# Patient Record
Sex: Female | Born: 1980 | Race: White | Hispanic: No | Marital: Married | Smoking: Never smoker
Health system: Southern US, Community
[De-identification: ages and names within clinical notes are randomized; demographics above are authoritative.]

## PROBLEM LIST (undated history)

## (undated) DIAGNOSIS — K589 Irritable bowel syndrome without diarrhea: Secondary | ICD-10-CM

## (undated) DIAGNOSIS — K219 Gastro-esophageal reflux disease without esophagitis: Secondary | ICD-10-CM

## (undated) HISTORY — PX: COSMETIC SURGERY: SHX468

## (undated) HISTORY — PX: BREAST ENHANCEMENT SURGERY: SHX7

## (undated) HISTORY — PX: WISDOM TOOTH EXTRACTION: SHX21

## (undated) HISTORY — PX: APPENDECTOMY: SHX54

## (undated) HISTORY — PX: TUBAL LIGATION: SHX77

## (undated) HISTORY — DX: Gastro-esophageal reflux disease without esophagitis: K21.9

---

## 2008-04-27 DIAGNOSIS — G43909 Migraine, unspecified, not intractable, without status migrainosus: Secondary | ICD-10-CM | POA: Insufficient documentation

## 2018-04-27 DIAGNOSIS — L409 Psoriasis, unspecified: Secondary | ICD-10-CM | POA: Insufficient documentation

## 2019-11-19 LAB — HM PAP SMEAR: HPV, high-risk: NEGATIVE

## 2020-09-08 ENCOUNTER — Encounter (HOSPITAL_COMMUNITY): Payer: Self-pay | Admitting: *Deleted

## 2020-09-08 ENCOUNTER — Ambulatory Visit (HOSPITAL_COMMUNITY)
Admission: EM | Admit: 2020-09-08 | Discharge: 2020-09-08 | Disposition: A | Discharge status: Home / Self Care | Attending: Emergency Medicine | Admitting: Emergency Medicine

## 2020-09-08 ENCOUNTER — Other Ambulatory Visit: Payer: Self-pay

## 2020-09-08 DIAGNOSIS — R197 Diarrhea, unspecified: Secondary | ICD-10-CM

## 2020-09-08 HISTORY — DX: Irritable bowel syndrome, unspecified: K58.9

## 2020-09-08 MED ORDER — ONDANSETRON 8 MG PO TBDP: ORAL_TABLET | ORAL | 0 refills | Status: DC

## 2020-09-08 MED ORDER — ACIDOPHILUS PROBIOTIC BLEND PO CAPS: 2.0000 | ORAL_CAPSULE | Freq: Two times a day (BID) | ORAL | 0 refills | Status: DC

## 2020-09-08 MED ORDER — DIPHENOXYLATE-ATROPINE 2.5-0.025 MG PO TABS: 1.0000 | ORAL_TABLET | Freq: Four times a day (QID) | ORAL | 0 refills | Status: DC | PRN

## 2020-09-08 NOTE — Discharge Instructions (Addendum)
Try Zofran and probiotics first.  You may use Lomotil if your symptoms persist.  Please bring back a stool sample as soon as you can.  I have placed a referral to GI.  Go to the ER for fevers above 100.4, severe abdominal pain, no urine output in 12 hours, or for any other concern  Below is a list of primary care practices who are taking new patients for you to follow-up with.  [facility] internal medicine clinic Ground Floor - [facility], [address] [place], [place], [place] [address] [phone]  [facility] at [facility] [address] [place], [place] [address] [phone]  [facility] [address] [place], [place] [address] [phone]  [facility] [phone] [address] [place] [address]  [facility]: [address] [place] [address]  [phone]  [facility]: [address] [place] [address]   [phone]  [facility]: [address] [place] [address]  [phone]  [facility] : [address] [place] [address] [phone]  [facility]: [address] [place] [address] [phone]  [facility]: [address] [place] [address] [phone]  Dr. Oneal Grout [address] [facility] [place] [address]  [phone]  Go to www.goodrx.com  or www.costplusdrugs.com to look up your medications. This will give you a list of where you can find your prescriptions at the most affordable prices. Or ask the pharmacist what the cash price is, or if they have any other discount programs available to help make your medication more affordable. This can be less expensive than what you would pay with  insurance.

## 2020-09-08 NOTE — ED Notes (Signed)
Supplies and instructions provided to pt in order to obtain home stool sample.  Pt verbalized understanding of all instructions.

## 2020-09-08 NOTE — ED Provider Notes (Signed)
HPI  SUBJECTIVE:  Phyllis Francis is a 40 y.o. female who presents with 4 days of watery, nonbloody diarrhea.  She reports anywhere from 12-14 episodes in a 24-hour period of time.  She states that it is "straight water".  She states that she feels gassy, has diffuse abdominal cramping worse before having a bowel movement and better afterwards.  She denies fevers, nausea, vomiting, change in urine output.  No recent travel, questionable leftovers, raw or undercooked foods, recent antibiotic, known exposure to C. 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  No exposure to chickens or reptiles.  No nasal congestion, rhinorrhea, sore throat, loss of sense of smell or taste, cough, shortness of breath.  No known COVID exposure.  She got the second dose of the Pfizer vaccine last year.  No antipyretic in the past 6 hours.  She does not take any medications on a regular basis.  She tried simethicone and Pepto-Bismol with very mild and temporary relief in her symptoms.  Symptoms are worse when she tries to eat.  She has a past medical history of IBS and is status post appendectomy.  No history of diabetes, hypertension, gastroparesis, C. difficile.  LMP: She is status post tubal ligation and denies the possibility being pregnant.  PMD: None.    Past Medical History:  Diagnosis Date  . IBS (irritable bowel syndrome)     Past Surgical History:  Procedure Laterality Date  . APPENDECTOMY    . BREAST ENHANCEMENT SURGERY    . CESAREAN SECTION    . TUBAL LIGATION    . WISDOM TOOTH EXTRACTION      Family History  Problem Relation Age of Onset  . Cancer Father     Social History   Tobacco Use  . Smoking status: Never Smoker  . Smokeless tobacco: Never Used  Vaping Use  . Vaping Use: Never used  Substance Use Topics  . Alcohol use: Yes    Comment: "in moderation, not recently"  . Drug use: Never    No current facility-administered medications for this encounter.  Current  Outpatient Medications:  .  diphenoxylate-atropine (LOMOTIL) 2.5-0.025 MG tablet, Take 1 tablet by mouth 4 (four) times daily as needed for diarrhea or loose stools., Disp: 30 tablet, Rfl: 0 .  ondansetron (ZOFRAN ODT) 8 MG disintegrating tablet, 1/2- 1 tablet q 8 hr prn nausea, vomiting, Disp: 20 tablet, Rfl: 0 .  Probiotic Product (ACIDOPHILUS PROBIOTIC BLEND) CAPS, Take 2 capsules by mouth 2 (two) times daily., Disp: 60 capsule, Rfl: 0  Allergies  Allergen Reactions  . Sulfa Antibiotics Anaphylaxis  . Cephalosporins Rash     ROS  As noted in HPI.   Physical Exam  BP 112/69   Pulse 67   Temp 98.4 F (36.9 C) (Oral)   Resp 18   LMP 08/16/2020 (Approximate)   SpO2 98%   Constitutional: Well developed, well nourished, no acute distress Eyes:  EOMI, conjunctiva normal bilaterally HENT: Normocephalic, atraumatic,mucus membranes moist Respiratory: Normal inspiratory effort Cardiovascular: Normal rate, regular rhythm, no murmurs rubs or gallop GI: Normal appearance, soft, nontender, nondistended.  Active bowel sounds.  No rebound, guarding skin: No rash, skin intact Musculoskeletal: no deformities Neurologic: Alert & oriented x 3, no focal neuro deficits Psychiatric: Speech and behavior appropriate   ED Course   Medications - No data to display  Orders Placed This Encounter  Procedures  . C difficile Toxins A+B W/Rflx    Standing Status:   Future  Standing Expiration Date:   09/10/2021  . Gastrointestinal Pathogen Panel PCR    Standing Status:   Future    Standing Expiration Date:   09/10/2021  . Ambulatory referral to Gastroenterology    Referral Priority:   Routine    Referral Type:   Consultation    Referral Reason:   Specialty Services Required    Number of Visits Requested:   1    No results found for this or any previous visit (from the past 24 hour(s)). No results found.  ED Clinical Impression  1. Diarrhea, unspecified type      ED  Assessment/Plan  Unable to identify the cause based on history.  She is having anywhere between 12-14 episodes of diarrhea per 24-hour period for the past 4 days which defines it as a severe illness.  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  Placing future order for this.  Patient unable to provide specimen right now.  Will send home with Zofran, probiotics, Lomotil, GI referral.  will give patient a primary care list and place order for assistance in finding a PMD.  ER return precautions given.  Discussed  MDM, treatment plan, and plan for follow-up with patient. Discussed sn/sx that should prompt return to the ED. patient agrees with plan.   Meds ordered this encounter  Medications  . diphenoxylate-atropine (LOMOTIL) 2.5-0.025 MG tablet    Sig: Take 1 tablet by mouth 4 (four) times daily as needed for diarrhea or loose stools.    Dispense:  30 tablet    Refill:  0  . Probiotic Product (ACIDOPHILUS PROBIOTIC BLEND) CAPS    Sig: Take 2 capsules by mouth 2 (two) times daily.    Dispense:  60 capsule    Refill:  0  . ondansetron (ZOFRAN ODT) 8 MG disintegrating tablet    Sig: 1/2- 1 tablet q 8 hr prn nausea, vomiting    Dispense:  20 tablet    Refill:  0      *This clinic note was created using Scientist, clinical (histocompatibility and immunogenetics). Therefore, there may be occasional mistakes despite careful proofreading.  ?    Domenick Gong, MD 09/09/20 [phone]

## 2020-09-08 NOTE — ED Triage Notes (Signed)
C/O watery diarrhea x 3 days; states has approx 4 episodes at night and maybe 8-10 episodes during the day.  Denies any blood in stool.  Denies n/v, fevers.  C/O intermittent abd cramping.

## 2020-09-09 ENCOUNTER — Telehealth (HOSPITAL_COMMUNITY): Payer: Self-pay | Admitting: Internal Medicine

## 2020-09-09 ENCOUNTER — Telehealth (HOSPITAL_COMMUNITY): Payer: Self-pay | Admitting: Family Medicine

## 2020-09-09 DIAGNOSIS — A09 Infectious gastroenteritis and colitis, unspecified: Secondary | ICD-10-CM

## 2020-09-09 NOTE — Telephone Encounter (Signed)
Incorrect stool labs placed initially, corrected GI panel, cannot find correct c dif labs at this time - will update if located

## 2020-09-10 ENCOUNTER — Encounter: Payer: Self-pay | Admitting: Physician Assistant

## 2020-09-10 LAB — GASTROINTESTINAL PANEL BY PCR, STOOL (REPLACES STOOL CULTURE)

## 2020-09-11 MED ORDER — AZITHROMYCIN 500 MG PO TABS: 500.0000 mg | ORAL_TABLET | Freq: Every day | ORAL | 0 refills | Status: AC

## 2020-09-11 NOTE — Telephone Encounter (Signed)
Patient with Enteroaggregative E. coli infection.  Talked to pt and sx have not resolved after 6 days. Per up-to-date, antibiotic therapy is reasonable in patients with a pathogenic E. coli infection with diarrhea that is severe i.e. more than 6 stools per day or has lasted 7 days or more.  Recommends azithromycin 500 mg once a day for 3 days.  E prescribed this to pharmacy on record.  She is to continue Lomotil, Zofran, push electrolyte containing fluids.  Will need to follow-up with GI if it persists after the antibiotics.  Discussed lab results, antibiotic prescription, and plan for follow-up with patient.  She agrees with plan.

## 2020-09-20 ENCOUNTER — Telehealth (HOSPITAL_BASED_OUTPATIENT_CLINIC_OR_DEPARTMENT_OTHER): Payer: Self-pay

## 2020-09-20 NOTE — Telephone Encounter (Signed)
-----   Message from Aaron Edelman, RN sent at 09/20/2020  1:09 PM EDT ----- Regarding: UC to PCP Patient needs to establish with PCP - routine

## 2020-10-02 ENCOUNTER — Encounter: Payer: Self-pay | Admitting: Physician Assistant

## 2020-10-02 ENCOUNTER — Ambulatory Visit (INDEPENDENT_AMBULATORY_CARE_PROVIDER_SITE_OTHER): Admitting: Physician Assistant

## 2020-10-02 ENCOUNTER — Other Ambulatory Visit: Payer: Self-pay

## 2020-10-02 DIAGNOSIS — K589 Irritable bowel syndrome without diarrhea: Secondary | ICD-10-CM | POA: Insufficient documentation

## 2020-10-02 DIAGNOSIS — K582 Mixed irritable bowel syndrome: Secondary | ICD-10-CM

## 2020-10-02 NOTE — Progress Notes (Signed)
Subjective:    Patient ID: Phyllis Francis, female    DOB: 03/26/1981, 40 y.o.   MRN: [id number]  HPI Phyllis Francis is a pleasant 40 year old white female, new to GI today referred after recent [facility] visit at which time she was seen with acute diarrhea. Patient is generally in good health and has not had any prior GI evaluation. She had onset of her acute symptoms in mid May with what she describes as terrible diarrhea that lasted for a total of about 8 days.  This was associated with extreme urgency and bad abdominal cramping gas and bloating.  She did not have any nausea or vomiting, no fever or chills.  She tried to treat herself for the first couple of days but when symptoms were not improving she was seen at urgent care.  She did have GI path panel done that returned positive for E AEC (Enteroaggregative  E. Coli).  She was then given a 3-day course of azithromycin.  She says by the time she had taken the third dose of the antibiotics symptoms had almost resolved. She was also advised to start an over-the-counter probiotic, and says that actually her bowel movements are more normal now than they had been in a very long time.  She is having normal bowel movements without any residual abdominal cramping or discomfort, no complaints of bloating or nausea. She does say that she feels that she has had IBS "all of my life".  It sounds as if she has had chronic issues for many years with alternating bowel habits, sometimes having frequent loose stools and other times having episodes of constipation.  She also feels that she is lactose intolerant.  She says she recently had a frosty from [facility] and was miserable afterwards with abdominal bloating and cramping.  She generally avoids lactose though she does consume some cheeses.  She also feels that she is intolerant to red meat just unable to digest easily and says is generally miserable after pizza and attributes this to the bread.  On further  discussion she is able to eat other bread products without any symptoms.  Family history is negative for GI disease as far she is aware  Review of Systems Pertinent positive and negative review of systems were noted in the above HPI section.  All other review of systems was otherwise negative.  Outpatient Encounter Medications as of 10/02/2020  Medication Sig  . Probiotic Product (PROBIOTIC DAILY PO) Take 2 tablets by mouth daily.  . [DISCONTINUED] diphenoxylate-atropine (LOMOTIL) 2.5-0.025 MG tablet Take 1 tablet by mouth 4 (four) times daily as needed for diarrhea or loose stools. (Patient not taking: Reported on 10/02/2020)  . [DISCONTINUED] ondansetron (ZOFRAN ODT) 8 MG disintegrating tablet 1/2- 1 tablet q 8 hr prn nausea, vomiting (Patient not taking: Reported on 10/02/2020)  . [DISCONTINUED] Probiotic Product (ACIDOPHILUS PROBIOTIC BLEND) CAPS Take 2 capsules by mouth 2 (two) times daily. (Patient not taking: Reported on 10/02/2020)   No facility-administered encounter medications on file as of 10/02/2020.   Allergies  Allergen Reactions  . Sulfa Antibiotics Anaphylaxis  . Cephalosporins Rash   Patient Active Problem List   Diagnosis Date Noted  . IBS (irritable bowel syndrome) 10/02/2020   Social History   Socioeconomic History  . Marital status: Married    Spouse name: Not on file  . Number of children: Not on file  . Years of education: Not on file  . Highest education level: Not on file  Occupational History  .  Not on file  Tobacco Use  . Smoking status: Never Smoker  . Smokeless tobacco: Never Used  Vaping Use  . Vaping Use: Never used  Substance and Sexual Activity  . Alcohol use: Yes    Comment: 0-1 drink per day  . Drug use: Never  . Sexual activity: Yes    Birth control/protection: Surgical  Other Topics Concern  . Not on file  Social History Narrative  . Not on file   Social Determinants of Health   Financial Resource Strain: Not on file  Food Insecurity:  Not on file  Transportation Needs: Not on file  Physical Activity: Not on file  Stress: Not on file  Social Connections: Not on file  Intimate Partner Violence: Not on file    Ms. Filosa's family history includes Cancer in her father and paternal grandfather.      Objective:    Vitals:   10/02/20 1452  BP: 108/62  Pulse: (!) 58    Physical Exam Well-developed well-nourished WF in no acute distress.  Height, [id number]  BMI25.6  HEENT; nontraumatic normocephalic, EOMI, PE R LA, sclera anicteric. Oropharynx; not done Neck; supple, no JVD Cardiovascular; regular rate and rhythm with S1-S2, no murmur rub or gallop Pulmonary; Clear bilaterally Abdomen; soft, nontender, nondistended, no palpable mass or hepatosplenomegaly, bowel sounds are active Rectal;not done Skin; benign exam, no jaundice rash or appreciable lesions Extremities; no clubbing cyanosis or edema skin warm and dry Neuro/Psych; alert and oriented x4, grossly nonfocal mood and affect appropriate       Assessment & Plan:   #35 40 year old white female with recent acute infectious colitis May 2022 diagnosed with EA EC which completely resolved with a short course of azithromycin.  #2 underlying IBS Long history of alternating bowel habits #3 lactose intolerance  Plan; Patient currently doing very well on a lactobacillus containing probiotic which has helped normalize bowel movements.  Advised to continue daily probiotic over the next couple of months and then may want to try intermittent probiotics. She was given a copy of a lactose-free diet, advised continued avoidance, can use Lactaid tablets prior to any known lactose consumption We discussed testing for celiac disease though she feels that she tolerates all other bread products etc. without difficulty and she opted not to be tested for this.  Follow-up will be on a as needed basis with myself or Dr. Meridee Score. We discussed colon cancer screening with  colonoscopy to start at age 35. Happy to see her back as needed.  Cincere Deprey S Janayla Marik PA-C 10/02/2020   Cc: Domenick Gong, MD

## 2020-10-02 NOTE — Progress Notes (Signed)
Attending Physician's Attestation   I have reviewed the chart.   I agree with the Advanced Practitioner's note, impression, and recommendations with any updates as below.    Valeri Sula Mansouraty, MD [facility] Advanced Endoscopy Office # [phone]  

## 2020-10-02 NOTE — Patient Instructions (Signed)
If you are age 41 or younger, your body mass index should be between 19-25. Your Body mass index is 25.65 kg/m. If this is out of the aformentioned range listed, please consider follow up with your Primary Care Provider.  __________________________________________________________  The [facility] providers would like to encourage you to use [facility] to communicate with providers for non-urgent requests or questions.  Due to long hold times on the telephone, sending your provider a message by [facility] may be a faster and more efficient way to get a response.  Please allow 48 business hours for a response.  Please remember that this is for non-urgent requests.   Continue daily probiotics for the next 2 months, then you can continue daily or as needed.  Drink 60 ounces of water daily  Avoid lactose.  Follow up with Mike Gip, PA-C or Dr. Barron Alvine as needed.  You will be due for your Colonoscopy at age 46.  Thank you for entrusting me with your care and choosing [facility].  Amy Esterwood, PA-C

## 2020-11-20 LAB — HM MAMMOGRAPHY

## 2020-11-28 ENCOUNTER — Other Ambulatory Visit: Payer: Self-pay | Admitting: Obstetrics and Gynecology

## 2020-11-28 DIAGNOSIS — R928 Other abnormal and inconclusive findings on diagnostic imaging of breast: Secondary | ICD-10-CM

## 2020-12-09 LAB — HM MAMMOGRAPHY

## 2020-12-16 ENCOUNTER — Other Ambulatory Visit

## 2020-12-16 ENCOUNTER — Encounter

## 2020-12-19 ENCOUNTER — Ambulatory Visit (INDEPENDENT_AMBULATORY_CARE_PROVIDER_SITE_OTHER): Admitting: Physician Assistant

## 2020-12-19 ENCOUNTER — Encounter: Payer: Self-pay | Admitting: Physician Assistant

## 2020-12-19 ENCOUNTER — Other Ambulatory Visit (INDEPENDENT_AMBULATORY_CARE_PROVIDER_SITE_OTHER)

## 2020-12-19 VITALS — BP 100/60 | HR 48 | Ht 65.0 in | Wt 155.0 lb

## 2020-12-19 DIAGNOSIS — R1030 Lower abdominal pain, unspecified: Secondary | ICD-10-CM | POA: Diagnosis not present

## 2020-12-19 DIAGNOSIS — K5900 Constipation, unspecified: Secondary | ICD-10-CM | POA: Diagnosis not present

## 2020-12-19 DIAGNOSIS — R1031 Right lower quadrant pain: Secondary | ICD-10-CM

## 2020-12-19 DIAGNOSIS — A09 Infectious gastroenteritis and colitis, unspecified: Secondary | ICD-10-CM | POA: Insufficient documentation

## 2020-12-19 LAB — CBC WITH DIFFERENTIAL/PLATELET
Basophils Absolute: 0.1 10*3/uL (ref 0.0–0.1)
Basophils Relative: 0.9 % (ref 0.0–3.0)
Eosinophils Absolute: 0.2 10*3/uL (ref 0.0–0.7)
Eosinophils Relative: 2.8 % (ref 0.0–5.0)
HCT: 39.1 % (ref 36.0–46.0)
Hemoglobin: 13.2 g/dL (ref 12.0–15.0)
Lymphocytes Relative: 29.8 % (ref 12.0–46.0)
Lymphs Abs: 1.9 10*3/uL (ref 0.7–4.0)
MCHC: 33.7 g/dL (ref 30.0–36.0)
MCV: 91.9 fl (ref 78.0–100.0)
Monocytes Absolute: 0.6 10*3/uL (ref 0.1–1.0)
Monocytes Relative: 10.1 % (ref 3.0–12.0)
Neutro Abs: 3.5 10*3/uL (ref 1.4–7.7)
Neutrophils Relative %: 56.4 % (ref 43.0–77.0)
Platelets: 157 10*3/uL (ref 150.0–400.0)
RBC: 4.26 Mil/uL (ref 3.87–5.11)
RDW: 12.7 % (ref 11.5–15.5)
WBC: 6.2 10*3/uL (ref 4.0–10.5)

## 2020-12-19 LAB — BASIC METABOLIC PANEL
BUN: 14 mg/dL (ref 6–23)
CO2: 26 mEq/L (ref 19–32)
Calcium: 9.7 mg/dL (ref 8.4–10.5)
Chloride: 104 mEq/L (ref 96–112)
Creatinine, Ser: 0.93 mg/dL (ref 0.40–1.20)
GFR: 76.63 mL/min (ref >60.00)
Glucose, Bld: 86 mg/dL (ref 70–99)
Potassium: 4.2 mEq/L (ref 3.5–5.1)
Sodium: 139 mEq/L (ref 135–145)

## 2020-12-19 LAB — C-REACTIVE PROTEIN: CRP: <1 mg/dL (ref 0.5–20.0)

## 2020-12-19 LAB — SEDIMENTATION RATE: Sed Rate: 5 mm/hr (ref 0–20)

## 2020-12-19 NOTE — Patient Instructions (Signed)
If you are age 41 or younger, your body mass index should be between 19-25. Your Body mass index is 25.79 kg/m. If this is out of the aformentioned range listed, please consider follow up with your Primary Care Provider.  __________________________________________________________  The [facility] providers would like to encourage you to use [facility] to communicate with providers for non-urgent requests or questions.  Due to long hold times on the telephone, sending your provider a message by [facility] may be a faster and more efficient way to get a response.  Please allow 48 business hours for a response.  Please remember that this is for non-urgent requests.   You have been scheduled for a CT scan of the abdomen and pelvis at [facility] ([address]---this is in the same building as [employer]).   You are scheduled on 12/26/2020 at 2:00 pm. You should arrive 15 minutes prior to your appointment time for registration. Please follow the written instructions below on the day of your exam:  WARNING: IF YOU ARE ALLERGIC TO IODINE/X-RAY DYE, PLEASE NOTIFY RADIOLOGY IMMEDIATELY AT [phone]! YOU WILL BE GIVEN A 13 HOUR PREMEDICATION PREP.  1) Do not eat anything after 10:00 am (4 hours prior to your test) 2) You have been given 2 bottles of oral contrast to drink. The solution may taste better if refrigerated, but do NOT add ice or any other liquid to this solution. Shake well before drinking.    Drink 1 bottle of contrast @ 12:00 pm (2 hours prior to your exam)  Drink 1 bottle of contrast @ 1:00 pm (1 hour prior to your exam)  You may take any medications as prescribed with a small amount of water, if necessary. If you take any of the following medications: METFORMIN, GLUCOPHAGE, GLUCOVANCE, AVANDAMET, RIOMET, FORTAMET, [place] MET, JANUMET, GLUMETZA or METAGLIP, you MAY be asked to HOLD this medication 48 hours AFTER the exam.  The purpose of you drinking the oral contrast  is to aid in the visualization of your intestinal tract. The contrast solution may cause some diarrhea. Depending on your individual set of symptoms, you may also receive an intravenous injection of x-ray contrast/dye. Plan on being at [facility] for 30 minutes or longer, depending on the type of exam you are having performed.  This test typically takes 30-45 minutes to complete.  If you have any questions regarding your exam or if you need to reschedule, you may call the CT department at [phone] between the hours of 8:00 am and 5:00 pm, Monday-Friday.  ____________________________________________________________  Your provider has requested that you go to the basement level for lab work before leaving today. Press "B" on the elevator. The lab is located at the first door on the left as you exit the elevator.  Amy Esterwood, PA-C recommends that you complete a bowel purge (to clean out your bowels). Please do the following: Purchase a bottle of Miralax over the counter as well as a box of 5 mg dulcolax tablets. Take 4 dulcolax tablets. Wait 1 hour. You will then drink 6-8 capfuls of Miralax mixed in an adequate amount of water/juice/gatorade (you may choose which of these liquids to drink) over the next 2-3 hours. You should expect results within 1 to 6 hours after completing the bowel purge.  Start Miralax 1 capful daily in 8 ounces of water or juice, after completing your Bowel purge.  You may use Dulcolax 1-2 a nightly.  Follow up pending the results of  your CT.  Thank you for entrusting me with your care and choosing [facility].  Amy Esterwood, PA-C

## 2020-12-19 NOTE — Progress Notes (Signed)
Subjective:    Patient ID: Phyllis Francis, female    DOB: Mar 04, 1981, 40 y.o.   MRN: [id number]  HPI Phyllis Francis" is a pleasant 40 year old white female, established with Dr. Rush Landmark who was seen by myself in June 2022 as a new patient.  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  When seen here in June was felt she had some postinfectious IBS symptoms superimposed on chronic IBS with alternating bowel habits and component of lactose intolerance.  She was feeling better at that time advised to continue a lactose-free diet, had taken a probiotic for an additional month. Comes in today stating that she has now developed constipation over the past 1 to 2 months and has been intermittently having pain in her right abdomen over the past month.  She is also had occasional mild left-sided abdominal pain but primarily this has been right-sided.  She has had to miss work because of constipation and need to use laxatives.  She has been taking Dulcolax as needed which she says is effective but will cause some cramping. She is status post appendectomy.  She describes the right-sided abdominal pain as aching and cramping in nature sometimes worse with being up and around walking etc.  This generally improves with a bowel movement which dulls the discomfort but does not resolve it.  Also with sensation of trapped gas.  He has been using Gas-X as needed. She may go as many as 3 days without a bowel movement and will feel miserable at that point.  Review of Systems Pertinent positive and negative review of systems were noted in the above HPI section.  All other review of systems was otherwise negative.   Outpatient Encounter Medications as of 12/19/2020  Medication Sig   Probiotic Product (PROBIOTIC DAILY PO) Take 2 tablets by mouth daily.   No facility-administered encounter medications on file as  of 12/19/2020.   Allergies  Allergen Reactions   Sulfa Antibiotics Anaphylaxis   Cephalosporins Rash   Patient Active Problem List   Diagnosis Date Noted   Infectious colitis 12/19/2020   IBS (irritable bowel syndrome) 10/02/2020   Social History   Socioeconomic History   Marital status: Married    Spouse name: Not on file   Number of children: Not on file   Years of education: Not on file   Highest education level: Not on file  Occupational History   Not on file  Tobacco Use   Smoking status: Never   Smokeless tobacco: Never  Vaping Use   Vaping Use: Never used  Substance and Sexual Activity   Alcohol use: Yes    Comment: 0-1 drink per day   Drug use: Never   Sexual activity: Yes    Birth control/protection: Surgical  Other Topics Concern   Not on file  Social History Narrative   Not on file   Social Determinants of Health   Financial Resource Strain: Not on file  Food Insecurity: Not on file  Transportation Needs: Not on file  Physical Activity: Not on file  Stress: Not on file  Social Connections: Not on file  Intimate Partner Violence: Not on file    Phyllis Francis's family history includes Cancer in her father and paternal grandfather.      Objective:    Vitals:   12/19/20 0956  BP: 100/60  Pulse: (!) 48    Physical Exam  Well-developed well-nourished young white female in no acute distress.  Weight, 155 BMI 25.7  HEENT; nontraumatic normocephalic, EOMI, PE R LA, sclera anicteric. Oropharynx; not examined today Neck; supple, no JVD Cardiovascular; regular rate and rhythm with S1-S2, no murmur rub or gallop Pulmonary; Clear bilaterally Abdomen; soft, nondistended, she is tender in the right mid and right lower quadrant, no guarding or rebound, no palpable mass or hepatosplenomegaly, bowel sounds are active Rectal; not done today Skin; benign exam, no jaundice rash or appreciable lesions Extremities; no clubbing cyanosis or edema skin warm and  dry Neuro/Psych; alert and oriented x4, grossly nonfocal mood and affect appropriate        Assessment & Plan:   #48 40 year old white female with history of an infectious colitis May 2022 and chronic underlying long-term IBS type symptoms with alternating bowel habits who comes in now with 1 to 69-monthhistory of constipation and a persisting right-sided abdominal pain which she describes as aching and cramping in nature. She will have some improvement in abdominal discomfort with a bowel movement but pain does not resolve.  She is tender on exam in the right mid and right lower quadrant. Patient is status post appendectomy\  Etiology of symptoms is not clear, this may be exacerbation of IBS however need to rule out other intra-abdominal inflammatory process, IBD.  Plan; CBC with differential, be met, sed rate, CRP Schedule for CT of the abdomen pelvis with contrast.\ I will give her a MiraLAX purge, then start MiraLAX 17 g in 8 ounces of water daily. She can continue to use Dulcolax 1 to 2 tablets daily if needed in addition to the MiraLAX. Further recommendations pending results of CT.  Montina Dorrance S Feliz Herard PA-C 12/19/2020   Cc: No ref. provider found

## 2020-12-19 NOTE — Progress Notes (Signed)
Attending Physician's Attestation   I have reviewed the chart.   I agree with the Advanced Practitioner's note, impression, and recommendations with any updates as below.    Kyian Obst Mansouraty, MD [facility] Office # [phone]  

## 2020-12-26 ENCOUNTER — Ambulatory Visit (INDEPENDENT_AMBULATORY_CARE_PROVIDER_SITE_OTHER)
Admission: RE | Admit: 2020-12-26 | Discharge: 2020-12-26 | Disposition: A | Discharge status: Home / Self Care | Source: Ambulatory Visit | Attending: Physician Assistant | Admitting: Physician Assistant

## 2020-12-26 ENCOUNTER — Other Ambulatory Visit: Payer: Self-pay

## 2020-12-26 DIAGNOSIS — R1030 Lower abdominal pain, unspecified: Secondary | ICD-10-CM | POA: Diagnosis not present

## 2020-12-26 DIAGNOSIS — R1031 Right lower quadrant pain: Secondary | ICD-10-CM

## 2020-12-26 DIAGNOSIS — K5900 Constipation, unspecified: Secondary | ICD-10-CM

## 2020-12-26 MED ORDER — IOHEXOL 300 MG/ML  SOLN
100.0000 mL | Freq: Once | INTRAMUSCULAR | Status: AC | PRN
  Administered 2020-12-26: 100 mL via INTRAVENOUS

## 2021-05-01 ENCOUNTER — Encounter (HOSPITAL_BASED_OUTPATIENT_CLINIC_OR_DEPARTMENT_OTHER): Payer: Self-pay | Admitting: Family Medicine

## 2021-05-02 ENCOUNTER — Encounter (HOSPITAL_BASED_OUTPATIENT_CLINIC_OR_DEPARTMENT_OTHER): Payer: Self-pay | Admitting: Family Medicine

## 2021-05-02 ENCOUNTER — Other Ambulatory Visit: Payer: Self-pay

## 2021-05-02 ENCOUNTER — Ambulatory Visit (INDEPENDENT_AMBULATORY_CARE_PROVIDER_SITE_OTHER): Admitting: Family Medicine

## 2021-05-02 DIAGNOSIS — L659 Nonscarring hair loss, unspecified: Secondary | ICD-10-CM

## 2021-05-02 DIAGNOSIS — R238 Other skin changes: Secondary | ICD-10-CM | POA: Insufficient documentation

## 2021-05-02 DIAGNOSIS — G43109 Migraine with aura, not intractable, without status migrainosus: Secondary | ICD-10-CM

## 2021-05-02 DIAGNOSIS — G43909 Migraine, unspecified, not intractable, without status migrainosus: Secondary | ICD-10-CM | POA: Insufficient documentation

## 2021-05-02 MED ORDER — SUMATRIPTAN SUCCINATE 50 MG PO TABS: 50.0000 mg | ORAL_TABLET | ORAL | 0 refills | Status: DC | PRN

## 2021-05-02 MED ORDER — KETOCONAZOLE 2 % EX SHAM: MEDICATED_SHAMPOO | CUTANEOUS | 1 refills | Status: DC

## 2021-05-02 MED ORDER — TRIAMCINOLONE ACETONIDE 0.1 % EX LOTN: 1.0000 "application " | TOPICAL_LOTION | Freq: Three times a day (TID) | CUTANEOUS | 0 refills | Status: DC

## 2021-05-02 NOTE — Progress Notes (Signed)
New Patient Office Visit  Subjective:  Patient ID: Phyllis Francis, female    DOB: 1981-04-05  Age: 41 y.o. MRN: [id number]  CC:  Chief Complaint  Patient presents with   Establish Care    No prior PCP in the last 3 years. Patient has primary been seeing OB and GI    Alopecia    Patient would like to have her thyroid levels assesed as she has been experiencing hair thinning and hair loss   Psoriasis    Patient has a patch on the back of her head orginating at the base of her neck and spreading upward into her hair line. She states she feels the break out is stress induced as she recently started school full time. Patient states she has tried to treat with OTC hydrocortisone cream but it is ineffective. The patch is scaly and itchy and get irritated when she washes her hair.   Migraine    Patient states that for the last several weeks she has noticed an increase in occuranes of migraines. She states they usually come in clusters that she could relate to her menstrual cycle but that seems to be changing as they can be induced by caffeine or alcohol. Patient states she is taking advil and chewable aspirin to manage and it is not effective    HPI Phyllis Francis is a 41 year old female presenting to establish in clinic.  She has current concerns as outlined above.  Past medical history significant for GERD, IBS.  Headaches: Reports that she has had a history of headaches in the past which have primarily coincided with her menstrual cycles.  However, recently she has noticed that headaches have become more frequent and less associated with menstrual cycles.  Some triggers that she has identified include caffeine and alcohol.  She will have associated nausea and photophobia at times.  Feels that she will have some preceding symptoms before onset of headache.  Primarily has been treating with ibuprofen and aspirin which generally do provide some relief although has had some headaches recently without  incomplete relief.  Skin irritation: Reports having red, flaky/scaly area of skin over posterior neck, extending into scalp.  Area has been very itchy.  She has tried over-the-counter hydrocortisone, changing her shampoo.  Occasionally she has had some similar areas in her eyebrows, on one of her ankles.  Generally those have been less symptomatic.  Feels that the skin irritation has been more noticeable over the past 2 years, had gradually been worsening but has maybe had slight improvement recently.  Reports that in addition to the above symptoms she is also felt that she has had increased hair loss, some stress/fatigue.  Patient works as a Associate Professor at [employer].  She has been working there since thousand 17.  She was completing some prepharmacy schooling around the time that the pandemic started.  However, she decided to stop classes due to the added stress. She is from [place] originally, was most recently living in [place], [place] before moving back to [place] in about 2018. Outside of work she enjoys weight training, running, cooking, Netflix.  Past Medical History:  Diagnosis Date   GERD (gastroesophageal reflux disease) 2004   IBS (irritable bowel syndrome)     Past Surgical History:  Procedure Laterality Date   APPENDECTOMY     BREAST ENHANCEMENT SURGERY     CESAREAN SECTION     COSMETIC SURGERY  2010   Breast augmentation   TUBAL LIGATION  WISDOM TOOTH EXTRACTION      Family History  Problem Relation Age of Onset   Cancer Father    Cancer Paternal Grandfather    Colon cancer Neg Hx    Esophageal cancer Neg Hx    Pancreatic cancer Neg Hx    Stomach cancer Neg Hx     Social History   Socioeconomic History   Marital status: Married    Spouse name: Not on file   Number of children: Not on file   Years of education: Not on file   Highest education level: Not on file  Occupational History   Not on file  Tobacco Use   Smoking status: Never    Smokeless tobacco: Never  Vaping Use   Vaping Use: Never used  Substance and Sexual Activity   Alcohol use: Yes    Comment: Occasionally   Drug use: Never   Sexual activity: Yes    Birth control/protection: Surgical  Other Topics Concern   Not on file  Social History Narrative   Not on file   Social Determinants of Health   Financial Resource Strain: Not on file  Food Insecurity: Not on file  Transportation Needs: Not on file  Physical Activity: Not on file  Stress: Not on file  Social Connections: Not on file  Intimate Partner Violence: Not on file    Objective:   Today's Vitals: BP 94/70    Pulse (!) 52    Ht 5\' 5"  (1.651 m)    Wt 154 lb (69.9 kg)    LMP 04/21/2021    SpO2 100%    BMI 25.63 kg/m   Physical Exam  41 year old female in no acute distress Cardiovascular exam with regular rate and rhythm, no murmur appreciated Lungs clear to auscultation bilaterally  Assessment & Plan:   Problem List Items Addressed This Visit       Cardiovascular and Mediastinum   Migraine    Has had progression of symptoms with lack of adequate control with use of OTC NSAID Will have patient complete headache diary to review at next visit Will proceed with use of triptan and assess for symptom control with this      Relevant Medications   SUMAtriptan (IMITREX) 50 MG tablet   Other Relevant Orders   TSH Rfx on Abnormal to Free T4     Other   Skin irritation    Uncertain etiology, differential includes seborrheic dermatitis, psoriasis We will proceed with use of ketoconazole shampoo as well as topical triamcinolone lotion We will also refer to dermatology for further evaluation, particularly if symptoms or not improving with above measures Can try to regularly blow dry hair so it does not remain wet over the area as well to see if this helps with symptoms      Relevant Medications   ketoconazole (NIZORAL) 2 % shampoo   triamcinolone lotion (KENALOG) 0.1 %   Other Relevant  Orders   Ambulatory referral to Dermatology   Hair loss    Uncertain etiology, will check thyroid function Will also refer to dermatology      Relevant Orders   Ambulatory referral to Dermatology   TSH Rfx on Abnormal to Free T4    Outpatient Encounter Medications as of 05/02/2021  Medication Sig   Collagen-Vitamin C-Biotin (COLLAGEN 1500/C PO) Take 1 capsule by mouth daily.   ketoconazole (NIZORAL) 2 % shampoo Apply to scalp 2-3 times per week for 8 weeks and then weekly thereafter   Multiple Vitamins-Minerals (MULTIVITAMIN WOMEN  PO) Take 1 tablet by mouth daily.   Probiotic Product (PROBIOTIC DAILY PO) Take 2 tablets by mouth daily.   SUMAtriptan (IMITREX) 50 MG tablet Take 1 tablet (50 mg total) by mouth every 2 (two) hours as needed for migraine. May repeat in 2 hours if headache persists or recurs. Do not take more than 2 doses in 24 hours.   triamcinolone lotion (KENALOG) 0.1 % Apply 1 application topically 3 (three) times daily.   No facility-administered encounter medications on file as of 05/02/2021.   Spent 45 minutes on this patient encounter, including preparation, chart review, face-to-face counseling with patient and coordination of care, and documentation of encounter  Follow-up: Return in about 4 weeks (around 05/30/2021) for Follow Up.  Plan for follow-up in about 3 to 4 weeks to monitor progress with above, particularly due to rash, migraines  Aldan Camey J [place], MD

## 2021-05-02 NOTE — Patient Instructions (Addendum)
°  Medication Instructions:  Your physician has recommended you make the following change in your medication:  -- START Sumatriptan (Imitrex) - Take 1 tablet as needed for migraines, may repeat dosage if symptoms don't resolve after 2 hours -- START Ketoconazole Shampoo - Use 2-3 times weekly for 8 weeks.  --If you need a refill on any your medications before your next appointment, please call your pharmacy first. If no refills are authorized on file call the office.-- Lab Work: Your physician has recommended that you have lab work today: TSH If you have labs (blood work) drawn today and your tests are completely normal, you will receive your results via [place] a phone call from our staff.  Please ensure you check your voicemail in the event that you authorized detailed messages to be left on a delegated number. If you have any lab test that is abnormal or we need to change your treatment, we will call you to review the results.  Referrals/Procedures/Imaging: A referral has been placed for you to [facility] for evaluation and treatment. Someone from the scheduling department will be in contact with you in regards to coordinating your consultation. If you do not hear from any of the schedulers within 7-10 business days please give their office a call.  [facility] [place] [id number]  Follow-Up: Your next appointment:   Your physician recommends that you schedule a follow-up appointment in: 3-4 WEEKS with Dr. de [place]  You will receive a text message or e-mail with a link to a survey about your care and experience with [place] today! We would greatly appreciate your feedback!   Thanks for letting us be apart of your health journey!!  Primary Care and Sports Medicine   Dr. Arlina Robes [place]   We encourage you to activate your patient portal called "MyChart".  Sign up information is provided on this After Visit Summary.   MyChart is used to connect with patients for Virtual Visits (Telemedicine).  Patients are able to view lab/test results, encounter notes, upcoming appointments, etc.  Non-urgent messages can be sent to your provider as well. To learn more about what you can do with MyChart, please visit --  [email or web address].

## 2021-05-02 NOTE — Assessment & Plan Note (Signed)
Uncertain etiology, will check thyroid function Will also refer to dermatology

## 2021-05-02 NOTE — Assessment & Plan Note (Signed)
Has had progression of symptoms with lack of adequate control with use of OTC NSAID Will have patient complete headache diary to review at next visit Will proceed with use of triptan and assess for symptom control with this

## 2021-05-02 NOTE — Assessment & Plan Note (Signed)
Uncertain etiology, differential includes seborrheic dermatitis, psoriasis We will proceed with use of ketoconazole shampoo as well as topical triamcinolone lotion We will also refer to dermatology for further evaluation, particularly if symptoms or not improving with above measures Can try to regularly blow dry hair so it does not remain wet over the area as well to see if this helps with symptoms

## 2021-05-03 LAB — TSH RFX ON ABNORMAL TO FREE T4: TSH: 1.9 u[IU]/mL (ref 0.450–4.500)

## 2021-05-30 ENCOUNTER — Ambulatory Visit (INDEPENDENT_AMBULATORY_CARE_PROVIDER_SITE_OTHER): Admitting: Family Medicine

## 2021-05-30 ENCOUNTER — Other Ambulatory Visit: Payer: Self-pay

## 2021-05-30 ENCOUNTER — Encounter (HOSPITAL_BASED_OUTPATIENT_CLINIC_OR_DEPARTMENT_OTHER): Payer: Self-pay | Admitting: Family Medicine

## 2021-05-30 VITALS — BP 117/72 | HR 66 | Resp 92 | Ht 65.0 in | Wt 154.0 lb

## 2021-05-30 DIAGNOSIS — G43109 Migraine with aura, not intractable, without status migrainosus: Secondary | ICD-10-CM | POA: Diagnosis not present

## 2021-05-30 DIAGNOSIS — R238 Other skin changes: Secondary | ICD-10-CM

## 2021-05-30 DIAGNOSIS — R197 Diarrhea, unspecified: Secondary | ICD-10-CM | POA: Diagnosis not present

## 2021-05-30 NOTE — Progress Notes (Signed)
° ° °  Procedures performed today:    None.  Independent interpretation of notes and tests performed by another provider:   None.  Brief History, Exam, Impression, and Recommendations:    BP 117/72    Pulse 66    Resp (!) 92    Ht 5\' 5"  (1.651 m)    Wt 154 lb (69.9 kg)    BMI 25.63 kg/m   Migraine Patient reports that since last office visit she has not had any headaches Has not had to use OTC medications or prescribed triptan Patient will continue to monitor for any headaches in the future, maintain headache diary moving forward  Skin irritation Has utilized both ketoconazole shampoo as well as triamcinolone topically.  Feels that ketoconazole shampoo has not led to significant change in skin irritation.  Has noted some improvement with use of triamcinolone, particularly with decreased itching On exam, patient continues to have rash/erythema at hairline along neck.  Does appear that erythema has improved since last visit Recommend continue with current measures, does have dermatology appointment arranged about 2 weeks from now, encouraged to continue with this  Diarrhea New issue for patient, started a little under 1 week ago, feels that her exposure was her son as there has been a reported stomach bug going around his school She did use Imodium yesterday and noticed some improvement in symptoms, still with more frequent bowel movements than typical, however rate has slowed with use of Imodium Has had some decreased appetite, has been tolerating p.o. intake, trying to stay hydrated Denies any blood in the stool, no significant abdominal pain. Patient in no acute distress on exam Recommend continuing with oral rehydration, can use loperamide as needed to help with symptoms Advised on red flags such as worsening abdominal pain, development of blood in stool, signs of dehydration  Plan for follow-up in 4 months or sooner as needed   ___________________________________________ Zakk Borgen  de [place], MD, ABFM, CAQSM Primary Care and [place]

## 2021-05-30 NOTE — Patient Instructions (Signed)
  Medication Instructions:  Your physician recommends that you continue on your current medications as directed. Please refer to the Current Medication list given to you today. --If you need a refill on any your medications before your next appointment, please call your pharmacy first. If no refills are authorized on file call the office.--  Follow-Up: Your next appointment:   Your physician recommends that you schedule a follow-up appointment in: 4 MONTHS with Dr. de [place]  You will receive a text message or e-mail with a link to a survey about your care and experience with us today! We would greatly appreciate your feedback!   Thanks for letting us be apart of your health journey!!  Primary Care and Sports Medicine   Dr. Raymond de [place]   We encourage you to activate your patient portal called "MyChart".  Sign up information is provided on this After Visit Summary.  MyChart is used to connect with patients for Virtual Visits (Telemedicine).  Patients are able to view lab/test results, encounter notes, upcoming appointments, etc.  Non-urgent messages can be sent to your provider as well. To learn more about what you can do with MyChart, please visit --  [email or web address]    

## 2021-05-30 NOTE — Assessment & Plan Note (Signed)
New issue for patient, started a little under 1 week ago, feels that Phyllis Francis exposure was Phyllis Francis son as there has been a reported stomach bug going around his school She did use Imodium yesterday and noticed some improvement in symptoms, still with more frequent bowel movements than typical, however rate has slowed with use of Imodium Has had some decreased appetite, has been tolerating p.o. intake, trying to stay hydrated Denies any blood in the stool, no significant abdominal pain. Patient in no acute distress on exam Recommend continuing with oral rehydration, can use loperamide as needed to help with symptoms Advised on red flags such as worsening abdominal pain, development of blood in stool, signs of dehydration

## 2021-05-30 NOTE — Assessment & Plan Note (Signed)
Patient reports that since last office visit she has not had any headaches Has not had to use OTC medications or prescribed triptan Patient will continue to monitor for any headaches in the future, maintain headache diary moving forward

## 2021-05-30 NOTE — Assessment & Plan Note (Signed)
Has utilized both ketoconazole shampoo as well as triamcinolone topically.  Feels that ketoconazole shampoo has not led to significant change in skin irritation.  Has noted some improvement with use of triamcinolone, particularly with decreased itching On exam, patient continues to have rash/erythema at hairline along neck.  Does appear that erythema has improved since last visit Recommend continue with current measures, does have dermatology appointment arranged about 2 weeks from now, encouraged to continue with this

## 2021-10-01 ENCOUNTER — Ambulatory Visit (HOSPITAL_BASED_OUTPATIENT_CLINIC_OR_DEPARTMENT_OTHER): Admitting: Family Medicine

## 2022-01-05 ENCOUNTER — Encounter (HOSPITAL_BASED_OUTPATIENT_CLINIC_OR_DEPARTMENT_OTHER): Payer: Self-pay | Admitting: Family Medicine

## 2022-01-06 ENCOUNTER — Telehealth: Admitting: Nurse Practitioner

## 2022-01-06 DIAGNOSIS — M545 Low back pain, unspecified: Secondary | ICD-10-CM

## 2022-01-06 MED ORDER — CYCLOBENZAPRINE HCL 10 MG PO TABS: 10.0000 mg | ORAL_TABLET | Freq: Three times a day (TID) | ORAL | 0 refills | Status: DC | PRN

## 2022-01-06 MED ORDER — NAPROXEN 500 MG PO TABS: 500.0000 mg | ORAL_TABLET | Freq: Two times a day (BID) | ORAL | 0 refills | Status: DC

## 2022-01-06 NOTE — Progress Notes (Signed)
We are sorry that you are not feeling well.  Here is how we plan to help!  Based on what you have shared with me it looks like you mostly have acute back pain.  Acute back pain is defined as musculoskeletal pain that can resolve in 1-3 weeks with conservative treatment.  I have prescribed Naprosyn 500 mg take one by mouth twice a day non-steroid anti-inflammatory (NSAID) as well as Flexeril 10 mg every eight hours as needed which is a muscle relaxer  Some patients experience stomach irritation or in increased heartburn with anti-inflammatory drugs.  Please keep in mind that muscle relaxer's can cause fatigue and should not be taken while at work or driving.  Back pain is very common.  The pain often gets better over time.  The cause of back pain is usually not dangerous.  Most people can learn to manage their back pain on their own.  Home Care Stay active.  Start with short walks on flat ground if you can.  Try to walk farther each day. Do not sit, drive or stand in one place for more than 30 minutes.  Do not stay in bed. Do not avoid exercise or work.  Activity can help your back heal faster. Be careful when you bend or lift an object.  Bend at your knees, keep the object close to you, and do not twist. Sleep on a firm mattress.  Lie on your side, and bend your knees.  If you lie on your back, put a pillow under your knees. Only take medicines as told by your doctor. Put ice on the injured area. Put ice in a plastic bag Place a towel between your skin and the bag Leave the ice on for 15-20 minutes, 3-4 times a day for the first 2-3 days. 210 After that, you can switch between ice and heat packs. Ask your doctor about back exercises or massage. Avoid feeling anxious or stressed.  Find good ways to deal with stress, such as exercise.  Get Help Right Way If: Your pain does not go away with rest or medicine. Your pain does not go away in 1 week. You have new problems. You do not feel  well. The pain spreads into your legs. You cannot control when you poop (bowel movement) or pee (urinate) You feel sick to your stomach (nauseous) or throw up (vomit) You have belly (abdominal) pain. You feel like you may pass out (faint). If you develop a fever.  Make Sure you: Understand these instructions. Will watch your condition Will get help right away if you are not doing well or get worse.  Your e-visit answers were reviewed by a board certified advanced clinical practitioner to complete your personal care plan.  Depending on the condition, your plan could have included both over the counter or prescription medications.  If there is a problem please reply  once you have received a response from your provider.  Your safety is important to us.  If you have drug allergies check your prescription carefully.    You can use MyChart to ask questions about today's visit, request a non-urgent call back, or ask for a work or school excuse for 24 hours related to this e-Visit. If it has been greater than 24 hours you will need to follow up with your provider, or enter a new e-Visit to address those concerns.  You will get an e-mail in the next two days asking about your experience.  I hope that   your e-visit has been valuable and will speed your recovery. Thank you for using e-visits.   Meds ordered this encounter  Medications   naproxen (NAPROSYN) 500 MG tablet    Sig: Take 1 tablet (500 mg total) by mouth 2 (two) times daily with a meal.    Dispense:  30 tablet    Refill:  0   cyclobenzaprine (FLEXERIL) 10 MG tablet    Sig: Take 1 tablet (10 mg total) by mouth 3 (three) times daily as needed for muscle spasms.    Dispense:  30 tablet    Refill:  0     I spent approximately 5 minutes reviewing the patient's history, current symptoms and coordinating their plan of care today.

## 2022-03-18 IMAGING — CT CT ABD-PELV W/ CM
2 of 5 series · 16 of 46 positions shown, 18 images · IV contrast (OMNIPAQUE 300)
Comparison: None.

CLINICAL DATA: Right lower quadrant pain for 2 months.
Constipation. Prior appendectomy.

EXAM:
CT ABDOMEN AND PELVIS WITH CONTRAST
TECHNIQUE: Multidetector CT imaging of the abdomen and pelvis was performed
using the standard protocol following bolus administration of
intravenous contrast.
CONTRAST:  100mL OMNIPAQUE IOHEXOL 300 MG/ML  SOLN

[Series 2: abd/pel w · axial · 0.70mm/px · z∈[-438,-38]mm · 13 of 90 slices shown, 15 images]
[im 5/90  soft-tissue]
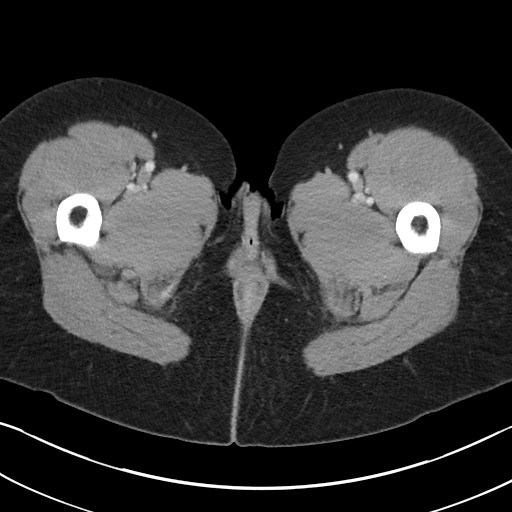
[im 5/90  bone]
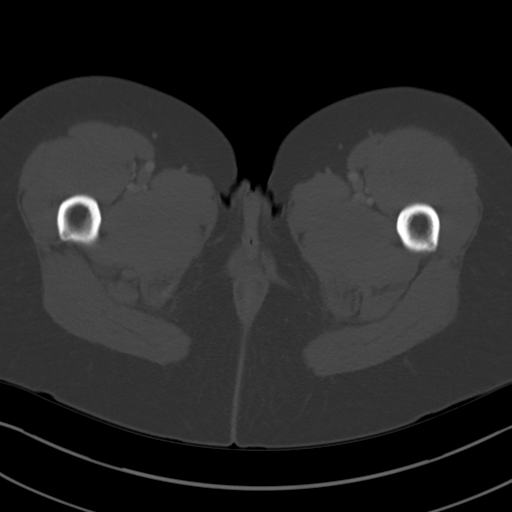
[im 10/90  soft-tissue]
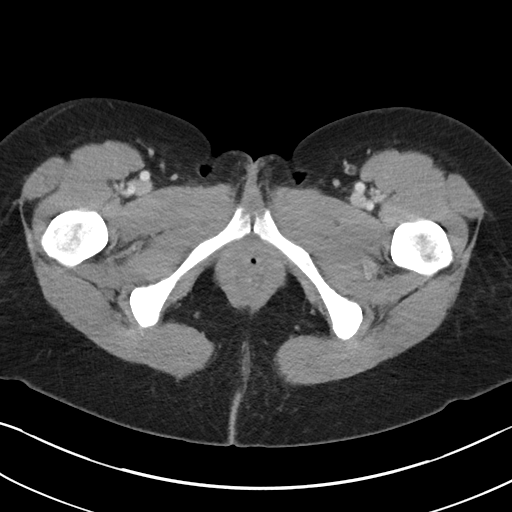
[im 20/90  soft-tissue]
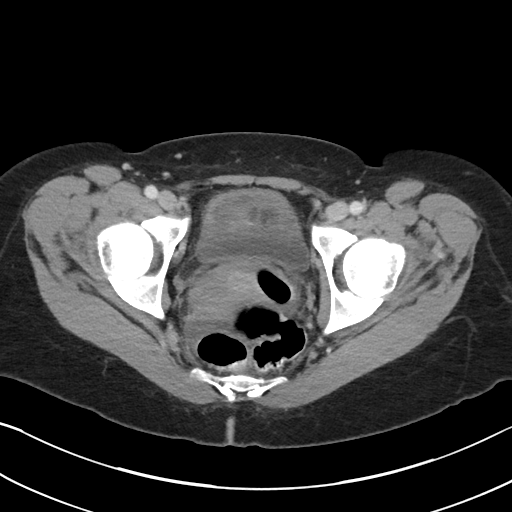
[im 25/90  soft-tissue]
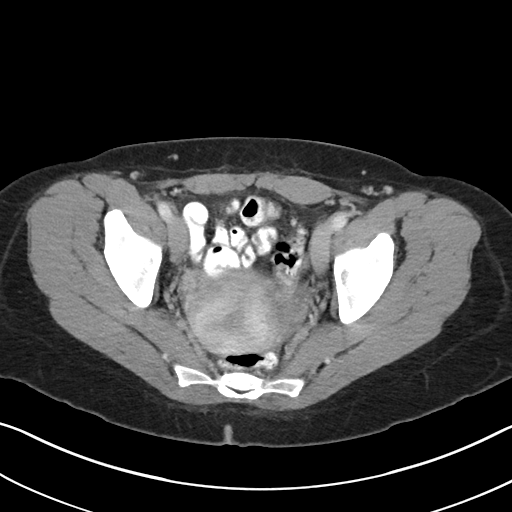
[im 30/90  soft-tissue]
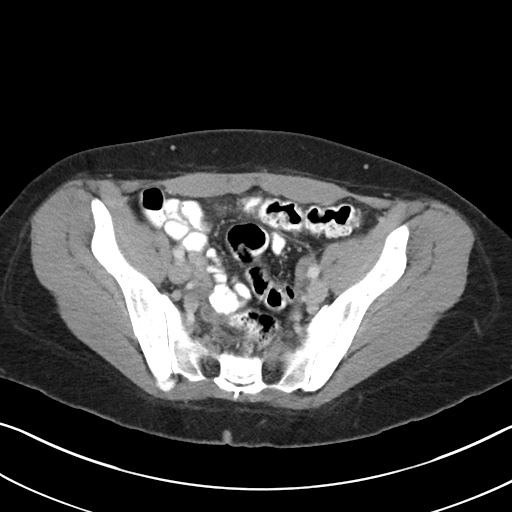
[im 40/90  soft-tissue]
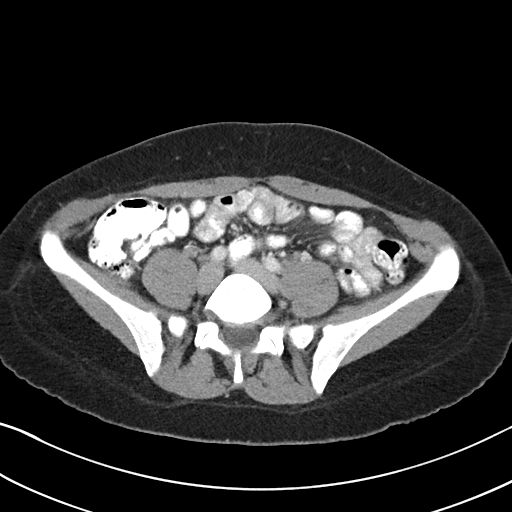
[im 45/90  soft-tissue]
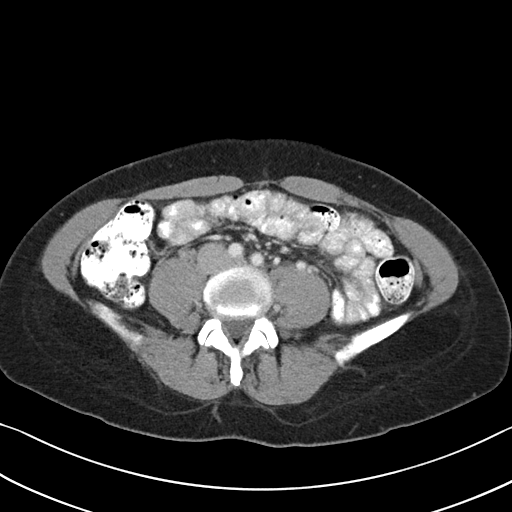
[im 50/90  soft-tissue]
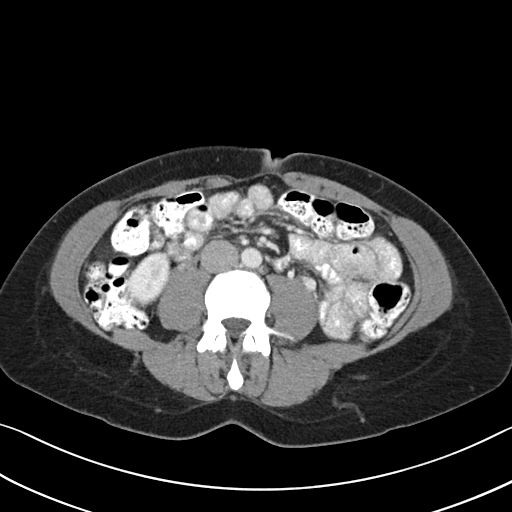
[im 60/90  soft-tissue]
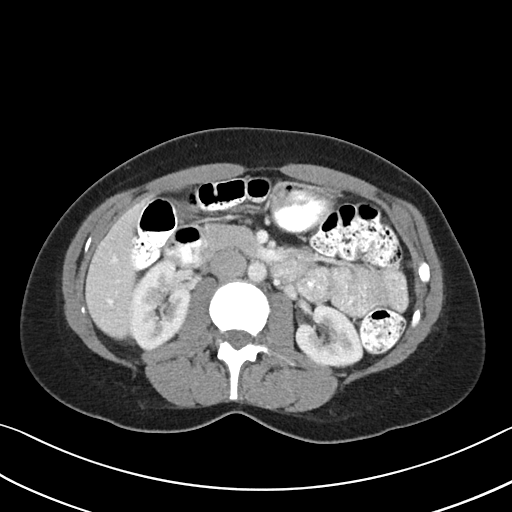
[im 60/90  bone]
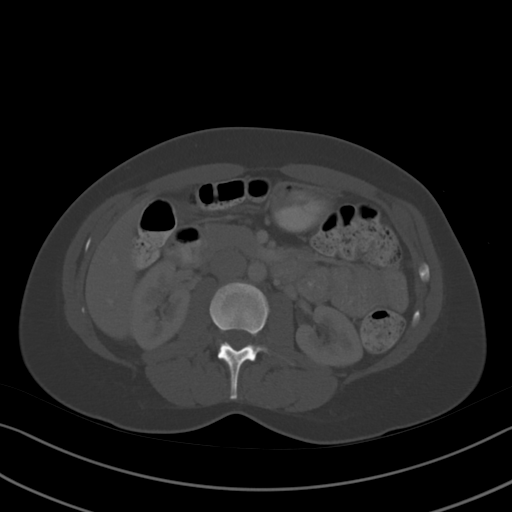
[im 65/90  soft-tissue]
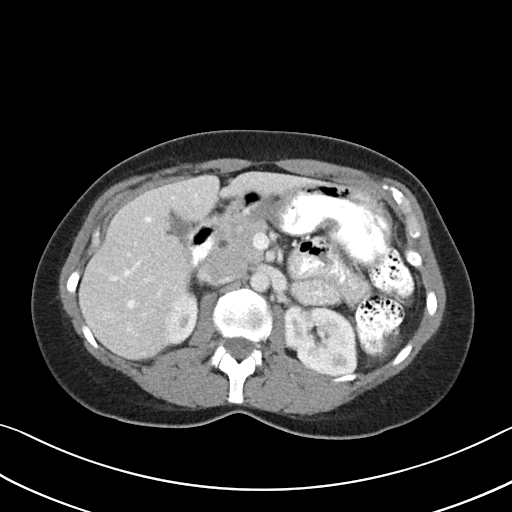
[im 70/90  soft-tissue]
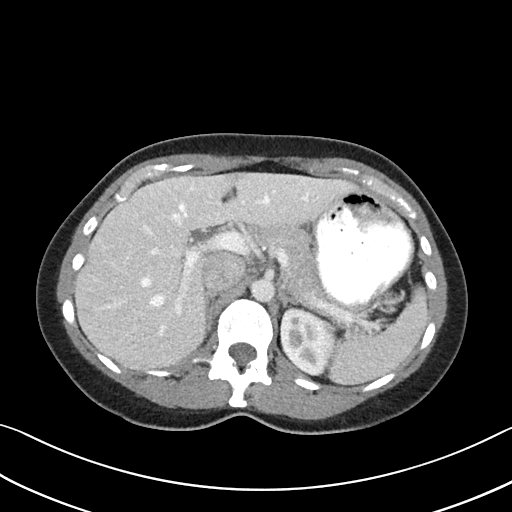
[im 80/90  soft-tissue]
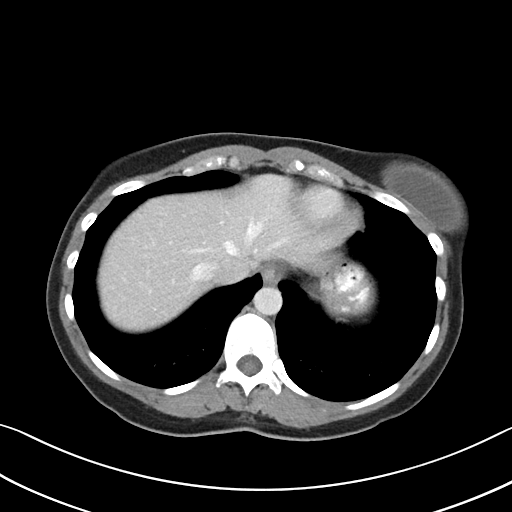
[im 85/90  soft-tissue]
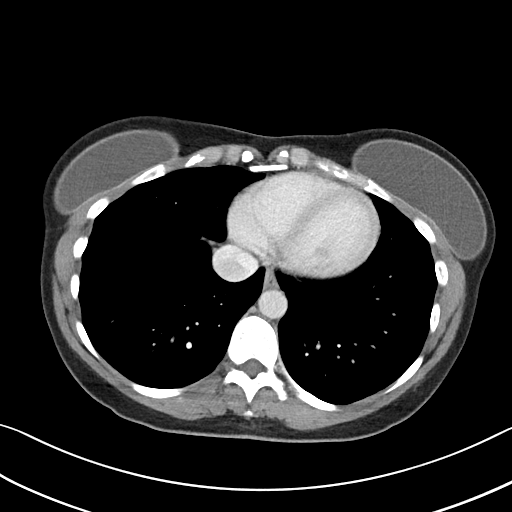

[Series 5: coronal st · coronal · 0.61mm/px · 3 of 64 slices shown]
[im 22/64  soft-tissue]
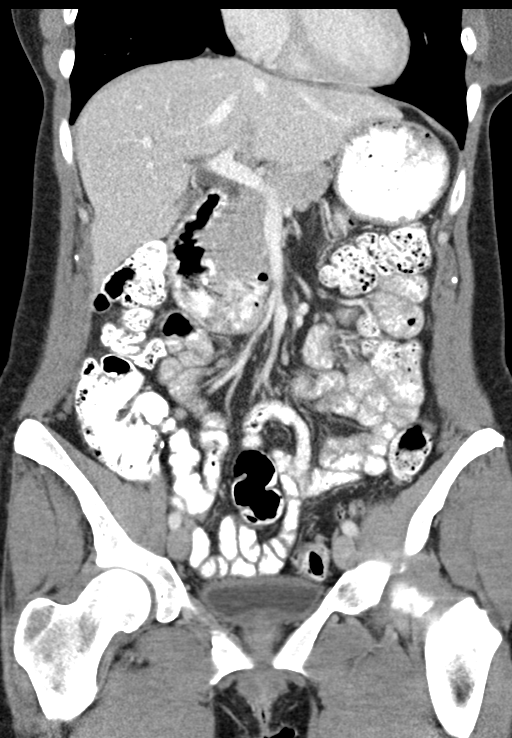
[im 29/64  soft-tissue]
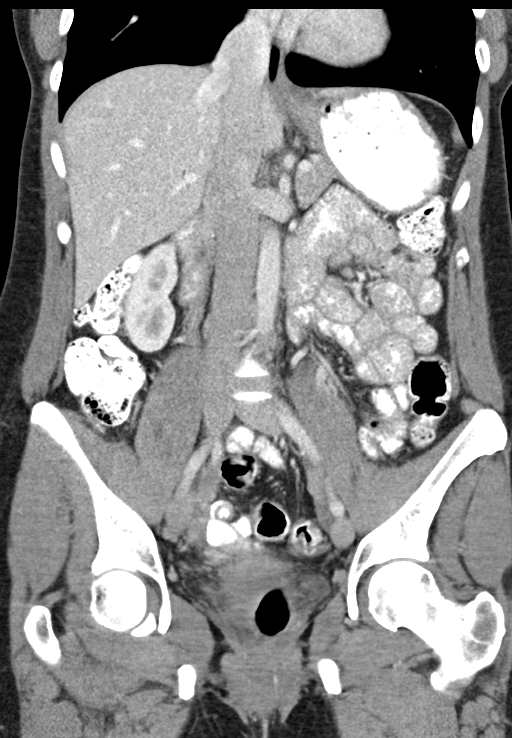
[im 36/64  soft-tissue]
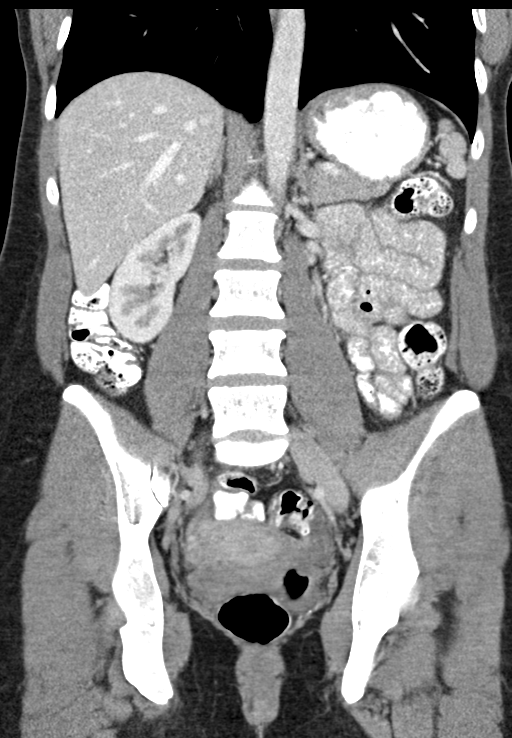

[16 of 46 positions shown; findings below may reference images not displayed]

FINDINGS: Lower Chest: No acute findings.

Hepatobiliary: No hepatic masses identified. Gallbladder is
unremarkable. No evidence of biliary ductal dilatation.

Pancreas:  No mass or inflammatory changes.

Spleen: Within normal limits in size and appearance.

Adrenals/Urinary Tract: No masses identified. No evidence of
ureteral calculi or hydronephrosis. Unremarkable unopacified urinary
bladder.

Stomach/Bowel: No evidence of obstruction, inflammatory process or
abnormal fluid collections.

Vascular/Lymphatic: No pathologically enlarged lymph nodes. No acute
vascular findings.

Reproductive:  No mass or other significant abnormality.

Other:  None.

Musculoskeletal:  No suspicious bone lesions identified.
IMPRESSION: Negative.  No acute findings or other significant abnormality.

## 2022-03-30 ENCOUNTER — Telehealth: Payer: Self-pay | Admitting: Emergency Medicine

## 2022-03-30 DIAGNOSIS — N898 Other specified noninflammatory disorders of vagina: Secondary | ICD-10-CM

## 2022-03-30 MED ORDER — FLUCONAZOLE 150 MG PO TABS: 150.0000 mg | ORAL_TABLET | Freq: Once | ORAL | 0 refills | Status: AC

## 2022-03-30 NOTE — Progress Notes (Signed)
E-Visit for Vaginal Symptoms  We are sorry that you are not feeling well. Here is how we plan to help! Based on what you shared with me it looks like you: May have a yeast vaginosis.  We will trial treatment for yeast vaginosis.  If this doesn't work AND because you are having pain with intercourse, the next step will be to see your regular doctor or urgent care and have pelvic exam where samples can be collected.  Vaginosis is an inflammation of the vagina that can result in discharge, itching and pain. The cause is usually a change in the normal balance of vaginal bacteria or an infection. Vaginosis can also result from reduced estrogen levels after menopause.  The most common causes of vaginosis are:   Bacterial vaginosis which results from an overgrowth of one on several organisms that are normally present in your vagina.   Yeast infections which are caused by a naturally occurring fungus called candida.   Vaginal atrophy (atrophic vaginosis) which results from the thinning of the vagina from reduced estrogen levels after menopause.   Trichomoniasis which is caused by a parasite and is commonly transmitted by sexual intercourse.  Factors that increase your risk of developing vaginosis include: Medications, such as antibiotics and steroids Uncontrolled diabetes Use of hygiene products such as bubble bath, vaginal spray or vaginal deodorant Douching Wearing damp or tight-fitting clothing Using an intrauterine device (IUD) for birth control Hormonal changes, such as those associated with pregnancy, birth control pills or menopause Sexual activity Having a sexually transmitted infection  Your treatment plan is A single Diflucan (fluconazole) 150mg  tablet once.  I have electronically sent this prescription into the pharmacy that you have chosen.  Be sure to take all of the medication as directed. Stop taking any medication if you develop a rash, tongue swelling or shortness of breath.  Mothers who are breast feeding should consider pumping and discarding their breast milk while on these antibiotics. However, there is no consensus that infant exposure at these doses would be harmful.  Remember that medication creams can weaken latex condoms.   HOME CARE:  Good hygiene may prevent some types of vaginosis from recurring and may relieve some symptoms:  Avoid baths, hot tubs and whirlpool spas. Rinse soap from your outer genital area after a shower, and dry the area well to prevent irritation. Don't use scented or harsh soaps, such as those with deodorant or antibacterial action. Avoid irritants. These include scented tampons and pads. Wipe from front to back after using the toilet. Doing so avoids spreading fecal bacteria to your vagina.  Other things that may help prevent vaginosis include:  Don't douche. Your vagina doesn't require cleansing other than normal bathing. Repetitive douching disrupts the normal organisms that reside in the vagina and can actually increase your risk of vaginal infection. Douching won't clear up a vaginal infection. Use a latex condom. Both female and female latex condoms may help you avoid infections spread by sexual contact. Wear cotton underwear. Also wear pantyhose with a cotton crotch. If you feel comfortable without it, skip wearing underwear to bed. Yeast thrives in [place] Your symptoms should improve in the next day or two.  GET HELP RIGHT AWAY IF:  You have pain in your lower abdomen ( pelvic area or over your ovaries) You develop nausea or vomiting You develop a fever Your discharge changes or worsens You have persistent pain with intercourse You develop shortness of breath, a rapid pulse, or you  faint.  These symptoms could be signs of problems or infections that need to be evaluated by a medical provider now.  MAKE SURE YOU   Understand these instructions. Will watch your condition. Will get help right away if you  are not doing well or get worse.  Thank you for choosing an e-visit.  Your e-visit answers were reviewed by a board certified advanced clinical practitioner to complete your personal care plan. Depending upon the condition, your plan could have included both over the counter or prescription medications.  Please review your pharmacy choice. Make sure the pharmacy is open so you can pick up prescription now. If there is a problem, you may contact your provider through [employer] and have the prescription routed to another pharmacy.  Your safety is important to [place]. If you have drug allergies check your prescription carefully.   For the next 24 hours you can use MyChart to ask questions about today's visit, request a non-urgent call back, or ask for a work or school excuse. You will get an email in the next two days asking about your experience. I hope that your e-visit has been valuable and will speed your recovery.  Approximately 5 minutes was used in reviewing the patient's chart, questionnaire, prescribing medications, and documentation.

## 2022-04-21 ENCOUNTER — Ambulatory Visit (HOSPITAL_COMMUNITY)
Admission: RE | Admit: 2022-04-21 | Discharge: 2022-04-21 | Disposition: A | Discharge status: Home / Self Care | Source: Ambulatory Visit | Attending: Emergency Medicine | Admitting: Emergency Medicine

## 2022-04-21 ENCOUNTER — Other Ambulatory Visit: Payer: Self-pay

## 2022-04-21 ENCOUNTER — Encounter (HOSPITAL_COMMUNITY): Payer: Self-pay

## 2022-04-21 VITALS — BP 95/55 | HR 77 | Temp 98.4°F | Resp 20

## 2022-04-21 DIAGNOSIS — I959 Hypotension, unspecified: Secondary | ICD-10-CM | POA: Diagnosis not present

## 2022-04-21 DIAGNOSIS — Z20822 Contact with and (suspected) exposure to covid-19: Secondary | ICD-10-CM | POA: Diagnosis present

## 2022-04-21 DIAGNOSIS — J101 Influenza due to other identified influenza virus with other respiratory manifestations: Secondary | ICD-10-CM | POA: Diagnosis present

## 2022-04-21 LAB — POC INFLUENZA A AND B ANTIGEN (URGENT CARE ONLY)
INFLUENZA A ANTIGEN, POC: NEGATIVE
INFLUENZA B ANTIGEN, POC: POSITIVE — AB

## 2022-04-21 MED ORDER — BENZONATATE 100 MG PO CAPS: 100.0000 mg | ORAL_CAPSULE | Freq: Three times a day (TID) | ORAL | 0 refills | Status: DC | PRN

## 2022-04-21 MED ORDER — SODIUM CHLORIDE 0.9 % IV BOLUS
1000.0000 mL | Freq: Once | INTRAVENOUS | Status: AC
  Administered 2022-04-21: 1000 mL via INTRAVENOUS

## 2022-04-21 MED ORDER — DM-GUAIFENESIN ER 30-600 MG PO TB12: 1.0000 | ORAL_TABLET | Freq: Two times a day (BID) | ORAL | 0 refills | Status: AC

## 2022-04-21 NOTE — Discharge Instructions (Addendum)
Influenza B test positive. We will call you if your covid test returns positive, and treat with antivirals if indicated.  Symptomatic care in the meantime. Lots of fluids. I recommend mucinex DM in combination with Tessalon.  You may have several more days of symptoms before they start to improve. Cough may linger for several weeks.  Please return to the urgent care if symptoms do not improve, or go to the emergency department if symptoms worsen.

## 2022-04-21 NOTE — ED Triage Notes (Addendum)
Complains of painful, productive cough, chills, fatigue, low fever ( 99.1, 99.4, 99.9 ).  Complains of runny nose   Has taken motrin and tylenol and no relief.  Reports this is the worst cough she has ever had  Husband tested positive for covid positive on 12/23.  Patient tested on 12/22 and results were negative

## 2022-04-21 NOTE — ED Provider Notes (Signed)
[facility]    CSN: [id number] Arrival date & time: 04/21/22  1430     History   Chief Complaint Chief Complaint  Patient presents with   Cough   Appointment    15:00    HPI Phyllis Francis is a 41 y.o. female.  Presents with 3 day history of productive cough Cough is painful in chest and throat Tmax 101.8 today, chills, body aches, fatigue Tried motrin and tylenol, guaifenesin, sudafed Has been drinking lots of fluids. Feels dizzy today No appetite. No vomiting/diarrhea  Husband had positive covid test 3 days ago. Patient had negative test a day before that, before her symptoms began  No hx lung problems. Non-smoker/never smoker  Past Medical History:  Diagnosis Date   GERD (gastroesophageal reflux disease) 2004   IBS (irritable bowel syndrome)     Patient Active Problem List   Diagnosis Date Noted   Diarrhea 05/30/2021   Skin irritation 05/02/2021   Migraine 05/02/2021   Hair loss 05/02/2021   Infectious colitis 12/19/2020   IBS (irritable bowel syndrome) 10/02/2020    Past Surgical History:  Procedure Laterality Date   APPENDECTOMY     BREAST ENHANCEMENT SURGERY     CESAREAN SECTION     COSMETIC SURGERY  2010   Breast augmentation   TUBAL LIGATION     WISDOM TOOTH EXTRACTION      OB History   No obstetric history on file.      Home Medications    Prior to Admission medications   Medication Sig Start Date End Date Taking? Authorizing Provider  benzonatate (TESSALON) 100 MG capsule Take 1 capsule (100 mg total) by mouth 3 (three) times daily as needed for cough. 04/21/22  Yes Mattia Osterman, Wells Guiles, PA-C  dextromethorphan-guaiFENesin (MUCINEX DM) 30-600 MG 12hr tablet Take 1 tablet by mouth 2 (two) times daily for 7 days. 04/21/22 04/28/22 Yes Dontaye Hur, Wells Guiles, PA-C  ketoconazole (NIZORAL) 2 % shampoo Apply to scalp 2-3 times per week for 8 weeks and then weekly thereafter 05/02/21   [place], Blondell Reveal, MD  Multiple Vitamins-Minerals (MULTIVITAMIN  WOMEN PO) Take 1 tablet by mouth daily.    [provider]  SUMAtriptan (IMITREX) 50 MG tablet Take 1 tablet (50 mg total) by mouth every 2 (two) hours as needed for migraine. May repeat in 2 hours if headache persists or recurs. Do not take more than 2 doses in 24 hours. 05/02/21   [place], Blondell Reveal, MD  triamcinolone lotion (KENALOG) 0.1 % Apply 1 application topically 3 (three) times daily. 05/02/21   [place], Blondell Reveal, MD    Family History Family History  Problem Relation Age of Onset   Cancer Father    Cancer Paternal Grandfather    Colon cancer Neg Hx    Esophageal cancer Neg Hx    Pancreatic cancer Neg Hx    Stomach cancer Neg Hx     Social History Social History   Tobacco Use   Smoking status: Never   Smokeless tobacco: Never  Vaping Use   Vaping Use: Never used  Substance Use Topics   Alcohol use: Yes    Comment: Occasionally   Drug use: Never     Allergies   Sulfa antibiotics and Cephalosporins   Review of Systems Review of Systems  Respiratory:  Positive for cough.    As per HPI  Physical Exam Triage Vital Signs ED Triage Vitals  Enc Vitals Group     BP  Pulse      Resp      Temp      Temp src      SpO2      Weight      Height      Head Circumference      Peak Flow      Pain Score      Pain Loc      Pain Edu?      Excl. in [place]?    No data found.  Updated Vital Signs BP (!) 95/55 (BP Location: Right Arm)   Pulse 77   Temp 98.4 F (36.9 C) (Oral)   Resp 20   LMP 04/16/2022   SpO2 100%    Physical Exam Vitals and nursing note reviewed.  Constitutional:      General: She is not in acute distress.    Appearance: She is not ill-appearing.  HENT:     Nose: No congestion.     Mouth/Throat:     Mouth: Mucous membranes are moist.     Pharynx: Oropharynx is clear.  Eyes:     Conjunctiva/sclera: Conjunctivae normal.  Cardiovascular:     Rate and Rhythm: Normal rate and regular rhythm.     Pulses: Normal pulses.     Heart  sounds: Normal heart sounds.  Pulmonary:     Effort: Pulmonary effort is normal.     Breath sounds: Normal breath sounds.     Comments: Clear throughout. Reactive cough Musculoskeletal:     Cervical back: Normal range of motion.  Lymphadenopathy:     Cervical: No cervical adenopathy.  Skin:    General: Skin is warm and dry.  Neurological:     General: No focal deficit present.     Mental Status: She is alert and oriented to person, place, and time.     Cranial Nerves: Cranial nerves 2-12 are intact.     Sensory: Sensation is intact.     Motor: Motor function is intact. No weakness.     Gait: Gait is intact.    UC Treatments / Results  Labs (all labs ordered are listed, but only abnormal results are displayed) Labs Reviewed  POC INFLUENZA A AND B ANTIGEN (URGENT CARE ONLY) - Abnormal; Notable for the following components:      Result Value   INFLUENZA B ANTIGEN, POC POSITIVE (*)    All other components within normal limits  SARS CORONAVIRUS 2 (TAT 6-24 HRS)    EKG  Radiology No results found.  Procedures Procedures  Medications Ordered in UC Medications  sodium chloride 0.9 % bolus 1,000 mL (0 mLs Intravenous Stopped 04/21/22 1730)    Initial Impression / Assessment and Plan / UC Course  I have reviewed the triage vital signs and the nursing notes.  Pertinent labs & imaging results that were available during my care of the patient were reviewed by me and considered in my medical decision making (see chart for details).  BP on arrival 84/57. Recheck is similar. IV fluid bolus given, BP up to 95/55 and patient feeling improved. Patient would like flu test although out of window for antivirals. Rapid flu B positive.  Close exposure to covid, would like test for this. Likely her first covid test was too early, before symptoms started. Repeat test pending. If positive, candidate for Paxlovid. GFR 76  Recommend symptomatic care, mucinex DM with Tessalon, lots of  fluids, tylenol/ibu as needed. Work note provided. Return precautions discussed. Patient agrees to plan  Final  Clinical Impressions(s) / UC Diagnoses   Final diagnoses:  Close exposure to COVID-19 virus  Influenza B  Hypotension, unspecified hypotension type     Discharge Instructions      Influenza B test positive. We will call you if your covid test returns positive, and treat with antivirals if indicated.  Symptomatic care in the meantime. Lots of fluids. I recommend mucinex DM in combination with Tessalon.  You may have several more days of symptoms before they start to improve. Cough may linger for several weeks.  Please return to the urgent care if symptoms do not improve, or go to the emergency department if symptoms worsen.     ED Prescriptions     Medication Sig Dispense Auth. Provider   benzonatate (TESSALON) 100 MG capsule Take 1 capsule (100 mg total) by mouth 3 (three) times daily as needed for cough. 21 capsule Leahann Lempke, PA-C   dextromethorphan-guaiFENesin (MUCINEX DM) 30-600 MG 12hr tablet Take 1 tablet by mouth 2 (two) times daily for 7 days. 14 tablet Dolton Shaker, Lurena Joiner, PA-C      PDMP not reviewed this encounter.   Adaia Matthies, Lurena Joiner, [place] 04/21/22 1747

## 2022-04-22 ENCOUNTER — Telehealth (HOSPITAL_COMMUNITY): Payer: Self-pay | Admitting: Emergency Medicine

## 2022-04-22 LAB — SARS CORONAVIRUS 2 (TAT 6-24 HRS): SARS Coronavirus 2: POSITIVE — AB

## 2022-04-22 MED ORDER — NIRMATRELVIR/RITONAVIR (PAXLOVID)TABLET: 3.0000 | ORAL_TABLET | Freq: Two times a day (BID) | ORAL | 0 refills | Status: AC

## 2022-08-13 ENCOUNTER — Ambulatory Visit (HOSPITAL_COMMUNITY)
Admission: RE | Admit: 2022-08-13 | Discharge: 2022-08-13 | Disposition: A | Discharge status: Home / Self Care | Source: Ambulatory Visit | Attending: Nurse Practitioner | Admitting: Nurse Practitioner

## 2022-08-13 ENCOUNTER — Encounter (HOSPITAL_COMMUNITY): Payer: Self-pay

## 2022-08-13 VITALS — BP 102/44 | HR 71 | Temp 98.8°F | Resp 18

## 2022-08-13 DIAGNOSIS — J019 Acute sinusitis, unspecified: Secondary | ICD-10-CM

## 2022-08-13 DIAGNOSIS — B9689 Other specified bacterial agents as the cause of diseases classified elsewhere: Secondary | ICD-10-CM

## 2022-08-13 MED ORDER — DOXYCYCLINE HYCLATE 100 MG PO CAPS: 100.0000 mg | ORAL_CAPSULE | Freq: Two times a day (BID) | ORAL | 0 refills | Status: AC

## 2022-08-13 NOTE — Discharge Instructions (Addendum)
You have a bacterial sinus infection.  Take the doxycycline as prescribed to treat it.   Some things that can make you feel better are: - Increased rest - Increasing fluid with water/sugar free electrolytes - Acetaminophen and ibuprofen as needed for fever/pain - Salt water gargling, chloraseptic spray and throat lozenges - OTC guaifenesin (Mucinex) 600 mg twice daily - Saline sinus flushes or a neti pot - Humidifying the air

## 2022-08-13 NOTE — ED Triage Notes (Signed)
3-week history of cough,sinus pressure and congestion. Pt has taken benadryl.

## 2022-08-13 NOTE — ED Provider Notes (Signed)
[facility]    CSN: [id number] Arrival date & time: 08/13/22  1434      History   Chief Complaint Chief Complaint  Patient presents with   Cough    Cough, runny nose, sinus drainage in throat. - Entered by patient    HPI Phyllis Francis is a 42 y.o. female.   Patient presents today with 3-week history of bodyaches, chills, congested and dry cough, shortness of breath with activity, runny and stuffy nose, postnasal drainage, sore throat, sinus pressure, headache, decreased appetite, and fatigue.  She denies known fevers, chest pain or tightness, ear pain, abdominal pain, nausea/vomiting, diarrhea.  No new rash.  Has been taking NyQuil and Benadryl for symptoms which only seems to help minimally.  Patient denies history of chronic lung disease.  Patient denies antibiotic use in the past 90 days.    Past Medical History:  Diagnosis Date   GERD (gastroesophageal reflux disease) 2004   IBS (irritable bowel syndrome)     Patient Active Problem List   Diagnosis Date Noted   Diarrhea 05/30/2021   Skin irritation 05/02/2021   Migraine 05/02/2021   Hair loss 05/02/2021   Infectious colitis 12/19/2020   IBS (irritable bowel syndrome) 10/02/2020    Past Surgical History:  Procedure Laterality Date   APPENDECTOMY     BREAST ENHANCEMENT SURGERY     CESAREAN SECTION     COSMETIC SURGERY  2010   Breast augmentation   TUBAL LIGATION     WISDOM TOOTH EXTRACTION      OB History   No obstetric history on file.      Home Medications    Prior to Admission medications   Medication Sig Start Date End Date Taking? Authorizing Provider  doxycycline (VIBRAMYCIN) 100 MG capsule Take 1 capsule (100 mg total) by mouth 2 (two) times daily for 7 days. 08/13/22 08/20/22 Yes Valentino Nose, NP  benzonatate (TESSALON) 100 MG capsule Take 1 capsule (100 mg total) by mouth 3 (three) times daily as needed for cough. 04/21/22   Rising, Lurena Joiner, PA-C  ketoconazole (NIZORAL) 2 %  shampoo Apply to scalp 2-3 times per week for 8 weeks and then weekly thereafter 05/02/21   [place], Buren Kos, MD  Multiple Vitamins-Minerals (MULTIVITAMIN WOMEN PO) Take 1 tablet by mouth daily.    [provider]  SUMAtriptan (IMITREX) 50 MG tablet Take 1 tablet (50 mg total) by mouth every 2 (two) hours as needed for migraine. May repeat in 2 hours if headache persists or recurs. Do not take more than 2 doses in 24 hours. 05/02/21   [place], Buren Kos, MD  triamcinolone lotion (KENALOG) 0.1 % Apply 1 application topically 3 (three) times daily. 05/02/21   [place], Buren Kos, MD    Family History Family History  Problem Relation Age of Onset   Cancer Father    Cancer Paternal Grandfather    Colon cancer Neg Hx    Esophageal cancer Neg Hx    Pancreatic cancer Neg Hx    Stomach cancer Neg Hx     Social History Social History   Tobacco Use   Smoking status: Never   Smokeless tobacco: Never  Vaping Use   Vaping Use: Never used  Substance Use Topics   Alcohol use: Yes    Comment: Occasionally   Drug use: Never     Allergies   Sulfa antibiotics and Cephalosporins   Review of Systems Review of Systems Per HPI  Physical Exam Triage  Vital Signs ED Triage Vitals [08/13/22 1509]  Enc Vitals Group     BP (!) 102/44     Pulse Rate 71     Resp 18     Temp 98.8 F (37.1 C)     Temp Source Oral     SpO2      Weight      Height      Head Circumference      Peak Flow      Pain Score      Pain Loc      Pain Edu?      Excl. in GC?    No data found.  Updated Vital Signs BP (!) 102/44 (BP Location: Left Arm)   Pulse 71   Temp 98.8 F (37.1 C) (Oral)   Resp 18   Visual Acuity Right Eye Distance:   Left Eye Distance:   Bilateral Distance:    Right Eye Near:   Left Eye Near:    Bilateral Near:     Physical Exam Vitals and nursing note reviewed.  Constitutional:      General: She is not in acute distress.    Appearance: Normal appearance. She is not  ill-appearing or toxic-appearing.  HENT:     Head: Normocephalic and atraumatic.     Right Ear: Tympanic membrane, ear canal and external ear normal.     Left Ear: Tympanic membrane, ear canal and external ear normal.     Nose: Congestion present. No rhinorrhea.     Right Sinus: No maxillary sinus tenderness or frontal sinus tenderness.     Left Sinus: Maxillary sinus tenderness and frontal sinus tenderness present.     Mouth/Throat:     Mouth: Mucous membranes are moist.     Pharynx: Oropharynx is clear. Posterior oropharyngeal erythema present. No oropharyngeal exudate.  Eyes:     General: No scleral icterus.    Extraocular Movements: Extraocular movements intact.  Cardiovascular:     Rate and Rhythm: Normal rate and regular rhythm.  Pulmonary:     Effort: Pulmonary effort is normal. No respiratory distress.     Breath sounds: Normal breath sounds. No wheezing, rhonchi or rales.  Abdominal:     General: Abdomen is flat. Bowel sounds are normal. There is no distension.     Palpations: Abdomen is soft.     Tenderness: There is no abdominal tenderness. There is no guarding.  Musculoskeletal:     Cervical back: Normal range of motion and neck supple.  Lymphadenopathy:     Cervical: Cervical adenopathy present.  Skin:    General: Skin is warm and dry.     Capillary Refill: Capillary refill takes less than 2 seconds.     Coloration: Skin is not jaundiced or pale.     Findings: No erythema or rash.  Neurological:     Mental Status: She is alert and oriented to person, place, and time.  Psychiatric:        Behavior: Behavior is cooperative.      UC Treatments / Results  Labs (all labs ordered are listed, but only abnormal results are displayed) Labs Reviewed - No data to display  EKG   Radiology No results found.  Procedures Procedures (including critical care time)  Medications Ordered in UC Medications - No data to display  Initial Impression / Assessment and Plan  / UC Course  I have reviewed the triage vital signs and the nursing notes.  Pertinent labs & imaging results that were  available during my care of the patient were reviewed by me and considered in my medical decision making (see chart for details).   Patient is well-appearing, normotensive, afebrile, not tachycardic, not tachypneic, oxygenating well on room air.    1. Acute bacterial sinusitis Given length of symptoms, sinus pressure today, will treat with doxycycline twice a day for 7 days Other supportive care discussed ER return precautions discussed with patient  The patient was given the opportunity to ask questions.  All questions answered to their satisfaction.  The patient is in agreement to this plan.    Final Clinical Impressions(s) / UC Diagnoses   Final diagnoses:  Acute bacterial sinusitis     Discharge Instructions      You have a bacterial sinus infection.  Take the doxycycline as prescribed to treat it.   Some things that can make you feel better are: - Increased rest - Increasing fluid with water/sugar free electrolytes - Acetaminophen and ibuprofen as needed for fever/pain - Salt water gargling, chloraseptic spray and throat lozenges - OTC guaifenesin (Mucinex) 600 mg twice daily - Saline sinus flushes or a neti pot - Humidifying the air     ED Prescriptions     Medication Sig Dispense Auth. Provider   doxycycline (VIBRAMYCIN) 100 MG capsule Take 1 capsule (100 mg total) by mouth 2 (two) times daily for 7 days. 14 capsule Valentino Nose, NP      PDMP not reviewed this encounter.   Valentino Nose, NP 08/13/22 [phone]

## 2022-11-02 ENCOUNTER — Encounter: Payer: Self-pay | Admitting: Nurse Practitioner

## 2022-11-02 ENCOUNTER — Telehealth: Admitting: Nurse Practitioner

## 2022-11-02 ENCOUNTER — Other Ambulatory Visit (HOSPITAL_BASED_OUTPATIENT_CLINIC_OR_DEPARTMENT_OTHER): Payer: Self-pay | Admitting: Family Medicine

## 2022-11-02 DIAGNOSIS — G43109 Migraine with aura, not intractable, without status migrainosus: Secondary | ICD-10-CM | POA: Diagnosis not present

## 2022-11-02 MED ORDER — SUMATRIPTAN SUCCINATE 50 MG PO TABS
50.00 mg | ORAL_TABLET | ORAL | 0 refills | Status: AC | PRN
Start: 2022-11-02 — End: ?

## 2022-11-02 NOTE — Progress Notes (Signed)
Virtual Visit Consent   Phyllis Francis, you are scheduled for a virtual visit with a [facility] provider today. Just as with appointments in the office, your consent must be obtained to participate. Your consent will be active for this visit and any virtual visit you may have with one of our providers in the next 365 days. If you have a MyChart account, a copy of this consent can be sent to you electronically.  As this is a virtual visit, video technology does not allow for your provider to perform a traditional examination. This may limit your provider's ability to fully assess your condition. If your provider identifies any concerns that need to be evaluated in person or the need to arrange testing (such as labs, EKG, etc.), we will make arrangements to do so. Although advances in technology are sophisticated, we cannot ensure that it will always work on either your end or our end. If the connection with a video visit is poor, the visit may have to be switched to a telephone visit. With either a video or telephone visit, we are not always able to ensure that we have a secure connection.  By engaging in this virtual visit, you consent to the provision of healthcare and authorize for your insurance to be billed (if applicable) for the services provided during this visit. Depending on your insurance coverage, you may receive a charge related to this service.  I need to obtain your verbal consent now. Are you willing to proceed with your visit today? Phyllis Francis has provided verbal consent on 11/02/2022 for a virtual visit (video or telephone). Viviano Simas, FNP  Date: 11/02/2022 12:35 PM  Virtual Visit via Video Note   I, Viviano Simas, connected with  Phyllis Francis  ([id number], 04-01-1981) on 11/02/22 at 12:45 PM EDT by a video-enabled telemedicine application and verified that I am speaking with the correct person using two identifiers.  Location: Patient: Virtual Visit Location Patient:  Home Provider: Virtual Visit Location Provider: Home Office   I discussed the limitations of evaluation and management by telemedicine and the availability of in person appointments. The patient expressed understanding and agreed to proceed.    History of Present Illness: Phyllis Francis is a 42 y.o. who identifies as a female who was assigned female at birth, and is being seen today for help with migraine treatment.   She has had migraines in the past  She has used Imitrex in the past   This typically happens just prior to her period  She has not needed to use anything in the past few months   Over the past few days she has been using tylenol extra strength, motrin and Excedrin without full relief   She is also experiencing some neck tension in combination with her migraine  Headache has fluctuated  Located in her forehead  She has been nauseated without vomiting   She does have Zofran but has not needed to use it.   She does have a primary care provider   Problems:  Patient Active Problem List   Diagnosis Date Noted   Diarrhea 05/30/2021   Skin irritation 05/02/2021   Migraine 05/02/2021   Hair loss 05/02/2021   Infectious colitis 12/19/2020   IBS (irritable bowel syndrome) 10/02/2020    Allergies:  Allergies  Allergen Reactions   Sulfa Antibiotics Anaphylaxis   Cephalosporins Rash   Medications:  Current Outpatient Medications:    benzonatate (TESSALON) 100 MG capsule, Take 1 capsule (100 mg total) by  mouth 3 (three) times daily as needed for cough., Disp: 21 capsule, Rfl: 0   ketoconazole (NIZORAL) 2 % shampoo, Apply to scalp 2-3 times per week for 8 weeks and then weekly thereafter, Disp: 120 mL, Rfl: 1   Multiple Vitamins-Minerals (MULTIVITAMIN WOMEN PO), Take 1 tablet by mouth daily., Disp: , Rfl:    SUMAtriptan (IMITREX) 50 MG tablet, Take 1 tablet (50 mg total) by mouth every 2 (two) hours as needed for migraine. May repeat in 2 hours if headache persists or  recurs. Do not take more than 2 doses in 24 hours., Disp: 30 tablet, Rfl: 0   triamcinolone lotion (KENALOG) 0.1 %, Apply 1 application topically 3 (three) times daily., Disp: 60 mL, Rfl: 0  Observations/Objective: Patient is well-developed, well-nourished in no acute distress.  Resting comfortably  at home.  Head is normocephalic, atraumatic.  No labored breathing.  Speech is clear and coherent with logical content.  Patient is alert and oriented at baseline.    Assessment and Plan:  1. Migraine with aura and without status migrainosus, not intractable  - SUMAtriptan (IMITREX) 50 MG tablet; Take 1 tablet (50 mg total) by mouth every 2 (two) hours as needed for migraine. May repeat in 2 hours if headache persists or recurs. Do not take more than 2 doses in 24 hours.  Dispense: 30 tablet; Refill: 0    Advised follow up with primary care regarding recurrence of headaches/migraines with new tension components.   Follow Up Instructions: I discussed the assessment and treatment plan with the patient. The patient was provided an opportunity to ask questions and all were answered. The patient agreed with the plan and demonstrated an understanding of the instructions.  A copy of instructions were sent to the patient via MyChart unless otherwise noted below.    The patient was advised to call back or seek an in-person evaluation if the symptoms worsen or if the condition fails to improve as anticipated.  Time:  I spent 15 minutes with the patient via telehealth technology discussing the above problems/concerns.    Viviano Simas, FNP

## 2023-03-22 ENCOUNTER — Ambulatory Visit (HOSPITAL_BASED_OUTPATIENT_CLINIC_OR_DEPARTMENT_OTHER): Admitting: Family Medicine

## 2023-03-22 ENCOUNTER — Encounter (HOSPITAL_BASED_OUTPATIENT_CLINIC_OR_DEPARTMENT_OTHER): Payer: Self-pay | Admitting: Family Medicine

## 2023-03-22 ENCOUNTER — Ambulatory Visit (HOSPITAL_BASED_OUTPATIENT_CLINIC_OR_DEPARTMENT_OTHER)

## 2023-03-22 VITALS — BP 107/45 | HR 65 | Temp 98.1°F | Ht 65.0 in | Wt 152.3 lb

## 2023-03-22 DIAGNOSIS — K37 Unspecified appendicitis: Secondary | ICD-10-CM | POA: Insufficient documentation

## 2023-03-22 DIAGNOSIS — M5442 Lumbago with sciatica, left side: Secondary | ICD-10-CM

## 2023-03-22 DIAGNOSIS — M545 Low back pain, unspecified: Secondary | ICD-10-CM | POA: Insufficient documentation

## 2023-03-22 DIAGNOSIS — R87610 Atypical squamous cells of undetermined significance on cytologic smear of cervix (ASC-US): Secondary | ICD-10-CM | POA: Insufficient documentation

## 2023-03-22 DIAGNOSIS — Z124 Encounter for screening for malignant neoplasm of cervix: Secondary | ICD-10-CM | POA: Insufficient documentation

## 2023-03-22 DIAGNOSIS — K589 Irritable bowel syndrome without diarrhea: Secondary | ICD-10-CM | POA: Insufficient documentation

## 2023-03-22 DIAGNOSIS — N871 Moderate cervical dysplasia: Secondary | ICD-10-CM | POA: Insufficient documentation

## 2023-03-22 DIAGNOSIS — R87619 Unspecified abnormal cytological findings in specimens from cervix uteri: Secondary | ICD-10-CM | POA: Insufficient documentation

## 2023-03-22 DIAGNOSIS — Z008 Encounter for other general examination: Secondary | ICD-10-CM | POA: Insufficient documentation

## 2023-03-22 DIAGNOSIS — Z3009 Encounter for other general counseling and advice on contraception: Secondary | ICD-10-CM

## 2023-03-22 HISTORY — DX: Encounter for other general counseling and advice on contraception: Z30.09

## 2023-03-22 MED ORDER — MELOXICAM 7.5 MG PO TABS: 7.5000 mg | ORAL_TABLET | Freq: Every day | ORAL | 0 refills | Status: AC

## 2023-03-22 MED ORDER — CYCLOBENZAPRINE HCL 5 MG PO TABS: 5.0000 mg | ORAL_TABLET | Freq: Three times a day (TID) | ORAL | 1 refills | Status: DC | PRN

## 2023-03-22 NOTE — Progress Notes (Signed)
    Procedures performed today:    None.  Independent interpretation of notes and tests performed by another provider:   None.  Brief History, Exam, Impression, and Recommendations:    BP (!) 107/45 (BP Location: Left Arm, Patient Position: Sitting, Cuff Size: Normal)   Pulse 65   Temp 98.1 F (36.7 C)   Ht 5\' 5"  (1.651 m)   Wt 152 lb 4.8 oz (69.1 kg)   SpO2 99%   BMI 25.34 kg/m   Acute left-sided low back pain with left-sided sciatica Assessment & Plan: Patient reports that about 2 weeks ago, she began to notice left-sided low back pain.  She has had associated pain radiating to the left lower extremity.  She has not had any bowel or bladder issues, denies any weakness or gait abnormalities.  Has had prior issues with back pain, however prior issues have felt different.  With symptoms in the past, she has not had radiation like which she has been experiencing with current back pain.  Does not recall any injury or inciting event. On exam, patient is in no acute distress, vital signs stable. Lumbar spine: Visual inspection without obvious abnormality No tenderness to palpation over spinous processes, no significant tenderness to palpation through paraspinal musculature bilaterally Reduced forward flexion, pain elicited.  Normal back extension, mild pain elicited. Negative stork test bilaterally.  Deep tendon reflexes at patella and Achilles normal bilaterally. Discussed that symptoms are likely related to underlying lumbar radiculopathy given history and exam.  Recommend initial conservative management including anti-inflammatory medication, topical treatment, referral to physical therapy.  We discussed consideration related to other medication classes well including gabapentin, steroid use.  Can proceed with low-dose muscle relaxer, patient has utilized in the past, aware of potential risk/side effects.  Discussed precautions related to steroid use, we will hold off on this for now,  however if symptoms become more problematic, she will let us know and we can consider meeting with steroid taper.  Discussed imaging consideration, we will proceed with x-ray imaging at this time.  If symptoms do persist or worsen, consider advanced imaging.  Referral to physical therapy placed today.  Plan for follow-up in about 6 weeks to monitor progress or sooner as needed  Orders: -     DG Lumbar Spine Complete; Future -     Ambulatory referral to Physical Therapy  Other orders -     Meloxicam; Take 1 tablet (7.5 mg total) by mouth daily.  Dispense: 30 tablet; Refill: 0 -     Cyclobenzaprine HCl; Take 1 tablet (5 mg total) by mouth 3 (three) times daily as needed for muscle spasms.  Dispense: 30 tablet; Refill: 1  Return in about 6 weeks (around 05/03/2023) for back pain.   ___________________________________________ Phyllis Marolf [place], MD, ABFM, [facility]

## 2023-03-22 NOTE — Assessment & Plan Note (Signed)
Patient reports that about 2 weeks ago, she began to notice left-sided low back pain.  She has had associated pain radiating to the left lower extremity.  She has not had any bowel or bladder issues, denies any weakness or gait abnormalities.  Has had prior issues with back pain, however prior issues have felt different.  With symptoms in the past, she has not had radiation like which she has been experiencing with current back pain.  Does not recall any injury or inciting event. On exam, patient is in no acute distress, vital signs stable. Lumbar spine: Visual inspection without obvious abnormality No tenderness to palpation over spinous processes, no significant tenderness to palpation through paraspinal musculature bilaterally Reduced forward flexion, pain elicited.  Normal back extension, mild pain elicited. Negative stork test bilaterally.  Deep tendon reflexes at patella and Achilles normal bilaterally. Discussed that symptoms are likely related to underlying lumbar radiculopathy given history and exam.  Recommend initial conservative management including anti-inflammatory medication, topical treatment, referral to physical therapy.  We discussed consideration related to other medication classes well including gabapentin, steroid use.  Can proceed with low-dose muscle relaxer, patient has utilized in the past, aware of potential risk/side effects.  Discussed precautions related to steroid use, we will hold off on this for now, however if symptoms become more problematic, she will let us know and we can consider meeting with steroid taper.  Discussed imaging consideration, we will proceed with x-ray imaging at this time.  If symptoms do persist or worsen, consider advanced imaging.  Referral to physical therapy placed today.  Plan for follow-up in about 6 weeks to monitor progress or sooner as needed

## 2023-04-26 ENCOUNTER — Ambulatory Visit (HOSPITAL_BASED_OUTPATIENT_CLINIC_OR_DEPARTMENT_OTHER): Attending: Family Medicine | Admitting: Physical Therapy

## 2023-04-26 ENCOUNTER — Encounter (HOSPITAL_BASED_OUTPATIENT_CLINIC_OR_DEPARTMENT_OTHER): Payer: Self-pay | Admitting: Physical Therapy

## 2023-04-26 ENCOUNTER — Other Ambulatory Visit: Payer: Self-pay

## 2023-04-26 DIAGNOSIS — M5442 Lumbago with sciatica, left side: Secondary | ICD-10-CM | POA: Insufficient documentation

## 2023-04-26 DIAGNOSIS — M545 Low back pain, unspecified: Secondary | ICD-10-CM | POA: Insufficient documentation

## 2023-04-26 NOTE — Therapy (Signed)
OUTPATIENT PHYSICAL THERAPY THORACOLUMBAR EVALUATION   Patient Name: Phyllis Francis MRN: [id number] DOB:05-Mar-1981, 42 y.o., female Today's Date: 04/26/2023  END OF SESSION:  PT End of Session - 04/26/23 1532     Visit Number 1    Number of Visits 13    Date for PT Re-Evaluation 07/25/23    Authorization Type [employer]    PT Start Time 1531    PT Stop Time 1615    PT Time Calculation (min) 44 min    Activity Tolerance Patient tolerated treatment well    Behavior During Therapy [facility] for tasks assessed/performed             Past Medical History:  Diagnosis Date   Encounter for counseling regarding contraception 03/22/2023   GERD (gastroesophageal reflux disease) 2004   IBS (irritable bowel syndrome)    Past Surgical History:  Procedure Laterality Date   APPENDECTOMY     BREAST ENHANCEMENT SURGERY     CESAREAN SECTION     COSMETIC SURGERY  2010   Breast augmentation   TUBAL LIGATION     WISDOM TOOTH EXTRACTION     Patient Active Problem List   Diagnosis Date Noted   Encounter for counseling regarding contraception 03/22/2023   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   Abnormal Pap smear of cervix 03/22/2023   Appendicitis 03/22/2023   Atypical squamous cell changes of cervix undetermined significance favor benign 03/22/2023   Encounter for physical examination of student 03/22/2023   Screening for malignant neoplasm of cervix 03/22/2023   Irritable bowel syndrome 03/22/2023   Low back pain 03/22/2023   Diarrhea 05/30/2021   Skin irritation 05/02/2021   Migraine 05/02/2021   Hair loss 05/02/2021   Infectious colitis 12/19/2020   IBS (irritable bowel syndrome) 10/02/2020   Psoriasis 04/27/2018   Migraines 04/27/2008    PCP:  de [place], Raymond J, MD    REFERRING PROVIDER:  de [place], Raymond J, MD    REFERRING DIAG:  [phone] (ICD-10-CM) - Acute left-sided low back pain with left-sided sciatica     Rationale for Evaluation and Treatment:  Rehabilitation  THERAPY DIAG:  Pain, lumbar region  ONSET DATE: >1 month  SUBJECTIVE:                                                                                                                                                                                           SUBJECTIVE STATEMENT: Pt states she hurt her back from possibly from squatting without a belt. She saw MD and was prescribed Mobic- has improved pain. Pt still able to do RDL without pain. Pt has been focusing on  form and technique with spouse who is a Systems analyst. Pt states that the pain will shoot down the leg and into the foot. It will mostly be posterior in nature. Denies NT. 135-150lbs without pain but higher will increase pain. Pt has been using a wedge for more upright positioning. Pt was doing 4x10 when she hurt her back. She was doing legs 2x/week. Denies red flags. Denies focus weakness.   PERTINENT HISTORY:  C sections  PAIN:  Are you having pain? Yes: NPRS scale: 0/10;  7/10 Pain location: L SIJ area  Pain description: "positional"  Aggravating factors: barbell back squat Relieving factors: meds,   PRECAUTIONS: None  RED FLAGS: None   WEIGHT BEARING RESTRICTIONS: No  FALLS:  Has patient fallen in last 6 months? No  LIVING ENVIRONMENT: Lives with: lives with their family   OCCUPATION: [employer]  PLOF: Independent  PATIENT GOALS: return to gym, exercise    OBJECTIVE:  Note: Objective measures were completed at Evaluation unless otherwise noted.   DIAGNOSTIC FINDINGS:    FINDINGS: There is no evidence of lumbar spine fracture. Alignment is normal. Intervertebral disc spaces are maintained.   IMPRESSION: Negative.   PATIENT SURVEYS:  FOTO 72 80 @ DC 5 pts MCII   MUSCLE LENGTH: Hamstrings: (-) supine 90/90   POSTURE: WFL   PALPATION: TTP and hypertonicity of bilat lumbar paraspinals Ext stiffness of L2-5 on L  TTP and hypertonicity of L hip rotators and  glutes   LUMBAR ROM:    AROM eval  Flexion WNL  Extension WNL  Right lateral flexion WNL  Left lateral flexion WNL  Right rotation WNL  Left rotation WNL   (Blank rows = not tested)   LOWER EXTREMITY ROM:   none painful; lack of L hip extension, ABD, flexion,  and ER rotation; WFL for tasks assessed in clinic   LOWER EXTREMITY MMT:  none painful   MMT Right eval Left eval  Hip flexion 61.8 lbs 63.1  Hip extension      Hip abduction 47.8 52.0  Hip adduction    Hip internal rotation      Hip external rotation      Knee flexion      Knee extension 5/5 5/5  Ankle dorsiflexion 5/5 5/5  Ankle plantarflexion      Ankle inversion      Ankle eversion       (Blank rows = not tested)   LUMBAR SPECIAL TESTS:  Slump test: Negative, SI Compression/distraction test: Negative, and FABER test: Negative  5x BW squats: decreased L hip max flexion position; lack of bilat ankle mobility; L hip ER deficit  Lunge: WFL bilat     GAIT: Distance walked: 71ft Assistive device utilized: None Level of assistance: Complete Independence Comments: WFL   TODAY'S TREATMENT:  DATE:  12/30   STM L hip  Exercises - Piriformis Mobilization on Foam Roll  - 1 x daily - 7 x weekly - 1 sets - 1 reps - 2-5 min hold - Standing Figure 4 Piriformis Stretch at Wall  - 1 x daily - 7 x weekly - 1 sets - 3 reps - 30 hold - Goblet Squat with Kettlebell  - 1 x daily - 2-3 x weekly - 2 sets - 10 reps - 5 hold - Standing Repeated Hip Abduction with Resistance  - 1 x daily - 2-3 x weekly - 2 sets - 10 reps     PATIENT EDUCATION:  Education details:  MOI, diagnosis, prognosis, anatomy, exercise progression, DOMS expectations, muscle firing,  envelope of function, HEP, POC  Person educated: Patient Education method: Explanation, Demonstration, Tactile cues, Verbal cues,  Education  comprehension: verbalized understanding, returned demonstration, and verbal cues required     HOME EXERCISE PROGRAM:     Access Code: [email or web address] URL: [email or web address] Date: 04/26/2023 Prepared by: Zebedee Iba              ASSESSMENT:   CLINICAL IMPRESSION:   Patient is a 42 y.o. female who was seen today for physical therapy evaluation and treatment for c/c of L sided LBP and leg pain. Pt's s/s appear consistent with L sided SIJ pain with radicular pain. Pt's pain is minimally sensitive and irritable with movement. Pt's is more stiffness dominant at this time as L hip mobility deficits is causing even loading with CKC movements across the pelvic girdle. Pt is most lacking in FABER position.  Plan to continue with L hip mobility and motor control at future sessions. Pt would benefit from continued skilled therapy in order to reach goals and maximize functional lumbopelvic strength and ROM for full return to PLOF. . ;in   OBJECTIVE IMPAIRMENTS Abnormal gait, decreased activity tolerance, decreased ROM, decreased strength, hypomobility, increased muscle spasms, impaired flexibility, improper body mechanics, postural dysfunction, and pain.    ACTIVITY LIMITATIONS carrying, lifting, bending, sitting, standing, squatting, and locomotion level   PARTICIPATION LIMITATIONS: community activity, occupation, and exercise   PERSONAL FACTORS Age, Fitness, Time since onset of injury/illness/exacerbation, and 1-2 comorbidities:  are also affecting patient's functional outcome.   REHAB POTENTIAL: Good   CLINICAL DECISION MAKING: Stable/uncomplicated   EVALUATION COMPLEXITY: Low         GOALS:     SHORT TERM GOALS: Target date: 06/07/2023     Pt will become independent with HEP in order to demonstrate synthesis of PT education.     Goal status: INITIAL   2.  Pt will report at least 2 pt reduction on NPRS scale for pain at worst in order to demonstrate functional improvement  with household activity, self care, and ADL.    Goal status: INITIAL   3.  Pt will score at least 5 pt increase on FOTO to demonstrate functional improvement in MCII and pt perceived function.     Goal status: INITIAL     LONG TERM GOALS: Target date: 07/25/2023      Pt  will become independent with final HEP in order to demonstrate synthesis of PT education.    Goal status: INITIAL   2.  Pt will score >/= 82 on FOTO to demonstrate improvement in perceived lumbar function.      Goal status: INITIAL   3.  Pt will be able to demonstrate/report ability to sit/stand/sleep for extended periods of time without pain in order  to demonstrate functional improvement and tolerance to static positioning.    Goal status: INITIAL   4.  Pt will be able to lift/squat/hold >100 lbs in order to demonstrate functional improvement in lumbopelvic strength for return to PLOF and exercise.    Goal status: INITIAL     PLAN:   PT FREQUENCY: 1-2x/week   PT DURATION: 12 weeks (plan to D/C in 8 wks)    PLANNED INTERVENTIONS: Therapeutic exercises, Therapeutic activity, Neuromuscular re-education, Balance training, Gait training, Patient/Family education, Joint manipulation, Joint mobilization, Stair training, Orthotic/Fit training, DME instructions, Aquatic Therapy, Dry Needling, Electrical stimulation, Spinal manipulation, Spinal mobilization, Cryotherapy, Moist heat, scar mobilization, Splintting, Taping, Vasopneumatic device, Traction, Ultrasound, Ionotophoresis 4mg /ml Dexamethasone, Manual therapy, and Re-evaluation.   PLAN FOR NEXT SESSION: L hip mobs, STM/joint mobs, SIJ shotgun, L hip SL squat    Zebedee Iba, PT 04/26/2023, 5:45 PM

## 2023-05-03 ENCOUNTER — Ambulatory Visit (HOSPITAL_BASED_OUTPATIENT_CLINIC_OR_DEPARTMENT_OTHER): Admitting: Family Medicine

## 2023-05-26 ENCOUNTER — Encounter (HOSPITAL_BASED_OUTPATIENT_CLINIC_OR_DEPARTMENT_OTHER): Payer: Self-pay

## 2023-05-27 ENCOUNTER — Encounter (HOSPITAL_BASED_OUTPATIENT_CLINIC_OR_DEPARTMENT_OTHER): Payer: Self-pay

## 2023-05-31 ENCOUNTER — Encounter (HOSPITAL_BASED_OUTPATIENT_CLINIC_OR_DEPARTMENT_OTHER): Payer: Self-pay | Admitting: Family Medicine

## 2023-05-31 ENCOUNTER — Ambulatory Visit (HOSPITAL_BASED_OUTPATIENT_CLINIC_OR_DEPARTMENT_OTHER): Admitting: Family Medicine

## 2023-05-31 VITALS — BP 110/69 | HR 50 | Ht 65.0 in | Wt 149.6 lb

## 2023-05-31 DIAGNOSIS — M5442 Lumbago with sciatica, left side: Secondary | ICD-10-CM

## 2023-05-31 NOTE — Patient Instructions (Signed)
  Medication Instructions:  Your physician recommends that you continue on your current medications as directed. Please refer to the Current Medication list given to you today. --If you need a refill on any your medications before your next appointment, please call your pharmacy first. If no refills are authorized on file call the office.--   Follow-Up: Your next appointment:   Your physician recommends that you schedule a follow-up appointment in: 6 months physical with Dr. de Peru  You will receive a text message or e-mail with a link to a survey about your care and experience with [place] today! We would greatly appreciate your feedback!   Thanks for letting us be apart of your health journey!!  Primary Care and Sports Medicine   Dr. Ceasar Mons [place]   We encourage you to activate your patient portal called "MyChart".  Sign up information is provided on this After Visit Summary.  MyChart is used to connect with patients for Virtual Visits (Telemedicine).  Patients are able to view lab/test results, encounter notes, upcoming appointments, etc.  Non-urgent messages can be sent to your provider as well. To learn more about what you can do with MyChart, please visit --  [email or web address].

## 2023-05-31 NOTE — Progress Notes (Signed)
    Procedures performed today:    None.  Independent interpretation of notes and tests performed by another provider:   None.  Brief History, Exam, Impression, and Recommendations:    BP 110/69 (BP Location: Right Arm, Patient Position: Sitting, Cuff Size: Normal)   Pulse (!) 50   Ht 5\' 5"  (1.651 m)   Wt 149 lb 9.6 oz (67.9 kg)   SpO2 100%   BMI 24.89 kg/m   Acute left-sided low back pain with left-sided sciatica Assessment & Plan: She reports that she is doing well.  She has had evaluation with physical therapy and found that she was helpful.  She has been working on home exercise program.  Still have increase in pain with increased activity and weight lifting but overall feels that she is doing well.  Reviewed x-rays, these were reassuring. Given progress, can continue with conservative measures, physical therapy.  Advised on red flags for which patient should return to the office for further evaluation.  If pain is worsening or if she is having radicular symptoms which persist despite conservative measures, consider proceeding with MRI   Return in about 6 months (around 11/28/2023) for CPE with fasting labs 1 week prior.   ___________________________________________ Anyjah Roundtree de [place], MD, ABFM, CAQSM Primary Care and Sports Medicine [facility]

## 2023-05-31 NOTE — Assessment & Plan Note (Signed)
She reports that she is doing well.  She has had evaluation with physical therapy and found that she was helpful.  She has been working on home exercise program.  Still have increase in pain with increased activity and weight lifting but overall feels that she is doing well.  Reviewed x-rays, these were reassuring. Given progress, can continue with conservative measures, physical therapy.  Advised on red flags for which patient should return to the office for further evaluation.  If pain is worsening or if she is having radicular symptoms which persist despite conservative measures, consider proceeding with MRI

## 2023-06-07 ENCOUNTER — Encounter (HOSPITAL_BASED_OUTPATIENT_CLINIC_OR_DEPARTMENT_OTHER): Admitting: Physical Therapy

## 2023-06-07 ENCOUNTER — Encounter (HOSPITAL_BASED_OUTPATIENT_CLINIC_OR_DEPARTMENT_OTHER): Payer: Self-pay | Admitting: Physical Therapy

## 2023-06-28 ENCOUNTER — Ambulatory Visit (HOSPITAL_BASED_OUTPATIENT_CLINIC_OR_DEPARTMENT_OTHER): Attending: Family Medicine | Admitting: Physical Therapy

## 2023-06-28 ENCOUNTER — Encounter (HOSPITAL_BASED_OUTPATIENT_CLINIC_OR_DEPARTMENT_OTHER): Payer: Self-pay | Admitting: Physical Therapy

## 2023-06-28 DIAGNOSIS — M545 Low back pain, unspecified: Secondary | ICD-10-CM | POA: Diagnosis present

## 2023-06-28 NOTE — Therapy (Signed)
 OUTPATIENT PHYSICAL THERAPY THORACOLUMBAR EVALUATION   Patient Name: Phyllis Francis MRN: [id number] DOB:[birth year], 43 y.o., female Today's Date: 06/28/2023  END OF SESSION:  PT End of Session - 06/28/23 1542     Visit Number 2    Number of Visits 13    Date for PT Re-Evaluation 07/25/23    Authorization Type [employer]    PT Start Time 1532    PT Stop Time 1604    PT Time Calculation (min) 32 min    Activity Tolerance Patient tolerated treatment well    Behavior During Therapy [facility] for tasks assessed/performed              Past Medical History:  Diagnosis Date   Encounter for counseling regarding contraception 03/22/2023   GERD (gastroesophageal reflux disease) 2004   IBS (irritable bowel syndrome)    Past Surgical History:  Procedure Laterality Date   APPENDECTOMY     BREAST ENHANCEMENT SURGERY     CESAREAN SECTION     COSMETIC SURGERY  2010   Breast augmentation   TUBAL LIGATION     WISDOM TOOTH EXTRACTION     Patient Active Problem List   Diagnosis Date Noted   Encounter for counseling regarding contraception 03/22/2023   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   Abnormal Pap smear of cervix 03/22/2023   Appendicitis 03/22/2023   Atypical squamous cell changes of cervix undetermined significance favor benign 03/22/2023   Encounter for physical examination of student 03/22/2023   Screening for malignant neoplasm of cervix 03/22/2023   Irritable bowel syndrome 03/22/2023   Low back pain 03/22/2023   Diarrhea 05/30/2021   Skin irritation 05/02/2021   Migraine 05/02/2021   Hair loss 05/02/2021   Infectious colitis 12/19/2020   IBS (irritable bowel syndrome) 10/02/2020   Psoriasis 04/27/2018   Migraines 04/27/2008    PCP:  de [place], Raymond J, MD    REFERRING PROVIDER:  de [place], Raymond J, MD    REFERRING DIAG:  [phone] (ICD-10-CM) - Acute left-sided low back pain with left-sided sciatica     Rationale for Evaluation and Treatment:  Rehabilitation  THERAPY DIAG:  Pain, lumbar region  ONSET DATE: >1 month  SUBJECTIVE:                                                                                                                                                                                           SUBJECTIVE STATEMENT:  Pt states that the pain is much better. She is DL up to 191 now but still has pain. Pain is still coming into the L HS. Pt is only have pain the day  after leg day, but stretching improves.   Eval:  Pt states she hurt her back from possibly from squatting without a belt. She saw MD and was prescribed Mobic- has improved pain. Pt still able to do RDL without pain. Pt has been focusing on form and technique with spouse who is a Systems analyst. Pt states that the pain will shoot down the leg and into the foot. It will mostly be posterior in nature. Denies NT. 135-150lbs without pain but higher will increase pain. Pt has been using a wedge for more upright positioning. Pt was doing 4x10 when she hurt her back. She was doing legs 2x/week. Denies red flags. Denies focus weakness.   PERTINENT HISTORY:  C sections  PAIN:  Are you having pain? Yes: NPRS scale: 0/10;  7/10 Pain location: L SIJ area  Pain description: "positional"  Aggravating factors: barbell back squat Relieving factors: meds,   PRECAUTIONS: None  RED FLAGS: None   WEIGHT BEARING RESTRICTIONS: No  FALLS:  Has patient fallen in last 6 months? No  LIVING ENVIRONMENT: Lives with: lives with their family   OCCUPATION: [employer]  PLOF: Independent  PATIENT GOALS: return to gym, exercise    OBJECTIVE:  Note: Objective measures were completed at Evaluation unless otherwise noted.   DIAGNOSTIC FINDINGS:    FINDINGS: There is no evidence of lumbar spine fracture. Alignment is normal. Intervertebral disc spaces are maintained.   IMPRESSION: Negative.   PATIENT SURVEYS:  FOTO 72 80 @ DC 5 pts MCII   MUSCLE  LENGTH: Hamstrings: (-) supine 90/90   POSTURE: WFL   PALPATION: TTP and hypertonicity of bilat lumbar paraspinals Ext stiffness of L2-5 on L  TTP and hypertonicity of L hip rotators and glutes   LUMBAR ROM:    AROM eval  Flexion WNL  Extension WNL  Right lateral flexion WNL  Left lateral flexion WNL  Right rotation WNL  Left rotation WNL   (Blank rows = not tested)   LOWER EXTREMITY ROM:   none painful; lack of L hip extension, ABD, flexion,  and ER rotation; WFL for tasks assessed in clinic   LOWER EXTREMITY MMT:  none painful   MMT Right eval Left eval  Hip flexion 61.8 lbs 63.1  Hip extension      Hip abduction 47.8 52.0  Hip adduction    Hip internal rotation      Hip external rotation      Knee flexion      Knee extension 5/5 5/5  Ankle dorsiflexion 5/5 5/5  Ankle plantarflexion      Ankle inversion      Ankle eversion       (Blank rows = not tested)   LUMBAR SPECIAL TESTS:  Slump test: Negative, SI Compression/distraction test: Negative, and FABER test: Negative  5x BW squats: decreased L hip max flexion position; lack of bilat ankle mobility; L hip ER deficit  Lunge: WFL bilat     GAIT: Distance walked: 26ft Assistive device utilized: None Level of assistance: Complete Independence Comments: WFL   TODAY'S TREATMENT:  DATE:  3/3   STM L hip L lumbar UPA grade III L1-5  Exercises - Piriformis Mobilization on Foam Roll  - 1 x daily - 7 x weekly - 1 sets - 1 reps - 2-5 min hold - [place]igeon Pose  - 2 x daily - 7 x weekly - 1 sets - 3 reps - 30 hold - Single leg bridge on bench  - 1 x daily - 3 x weekly - 3 sets - 8 reps - Side Plank with Clam and Green Band Resistance  - 1 x daily - 3 x weekly - 2 sets - 10 reps - 2 hold - Lateral Step Down  - 1 x daily - 3 x weekly - 2 sets - 10 reps   12/30   STM L hip  Exercises -  Piriformis Mobilization on Foam Roll  - 1 x daily - 7 x weekly - 1 sets - 1 reps - 2-5 min hold - Standing Figure 4 Piriformis Stretch at Wall  - 1 x daily - 7 x weekly - 1 sets - 3 reps - 30 hold - Goblet Squat with Kettlebell  - 1 x daily - 2-3 x weekly - 2 sets - 10 reps - 5 hold - Standing Repeated Hip Abduction with Resistance  - 1 x daily - 2-3 x weekly - 2 sets - 10 reps     PATIENT EDUCATION:  Education details:  MOI, diagnosis, prognosis, anatomy, exercise progression, DOMS expectations, muscle firing,  envelope of function, HEP, POC  Person educated: Patient Education method: Explanation, Demonstration, Tactile cues, Verbal cues,  Education comprehension: verbalized understanding, returned demonstration, and verbal cues required     HOME EXERCISE PROGRAM:     Access Code: [email or web address] URL: [email or web address] Date: 04/26/2023 Prepared by: Zebedee Iba              ASSESSMENT:   CLINICAL IMPRESSION:   Pt HEP advanced today to improve L LE strength and control as pt lacks hip hinge and hip extension firing on L side as compared to R. Pt without pain during session but fatigues and loses motor control of L LE much faster than R. HEP updated accordingly. Plan to continue with L LE strength focus. Consider pendulum lunge, [place], and/or SL RDL with row at next. Plan for FOTO at next if equipment is available. Pt would benefit from continued skilled therapy in order to reach goals and maximize functional lumbopelvic strength and ROM for full return to PLOF.    OBJECTIVE IMPAIRMENTS Abnormal gait, decreased activity tolerance, decreased ROM, decreased strength, hypomobility, increased muscle spasms, impaired flexibility, improper body mechanics, postural dysfunction, and pain.    ACTIVITY LIMITATIONS carrying, lifting, bending, sitting, standing, squatting, and locomotion level   PARTICIPATION LIMITATIONS: community activity, occupation, and exercise   PERSONAL  FACTORS Age, Fitness, Time since onset of injury/illness/exacerbation, and 1-2 comorbidities:  are also affecting patient's functional outcome.   REHAB POTENTIAL: Good   CLINICAL DECISION MAKING: Stable/uncomplicated   EVALUATION COMPLEXITY: Low         GOALS:     SHORT TERM GOALS: Target date: 06/07/2023     Pt will become independent with HEP in order to demonstrate synthesis of PT education.     Goal status: MET   2.  Pt will report at least 2 pt reduction on NPRS scale for pain at worst in order to demonstrate functional improvement with household activity, self care, and ADL.    Goal status: MET   3.  Pt will score at least 5 pt increase on FOTO to demonstrate functional improvement in MCII and pt perceived function.     Goal status: INITIAL     LONG TERM GOALS: Target date: 07/25/2023      Pt  will become independent with final HEP in order to demonstrate synthesis of PT education.    Goal status: INITIAL   2.  Pt will score >/= 82 on FOTO to demonstrate improvement in perceived lumbar function.      Goal status: INITIAL   3.  Pt will be able to demonstrate/report ability to sit/stand/sleep for extended periods of time without pain in order to demonstrate functional improvement and tolerance to static positioning.    Goal status: INITIAL   4.  Pt will be able to lift/squat/hold >100 lbs in order to demonstrate functional improvement in lumbopelvic strength for return to PLOF and exercise.    Goal status: INITIAL     PLAN:   PT FREQUENCY: 1-2x/week   PT DURATION: 12 weeks (plan to D/C in 8 wks)    PLANNED INTERVENTIONS: Therapeutic exercises, Therapeutic activity, Neuromuscular re-education, Balance training, Gait training, Patient/Family education, Joint manipulation, Joint mobilization, Stair training, Orthotic/Fit training, DME instructions, Aquatic Therapy, Dry Needling, Electrical stimulation, Spinal manipulation, Spinal mobilization, Cryotherapy,  Moist heat, scar mobilization, Splintting, Taping, Vasopneumatic device, Traction, Ultrasound, Ionotophoresis 4mg /ml Dexamethasone, Manual therapy, and Re-evaluation.   PLAN FOR NEXT SESSION: L hip mobs, STM/joint mobs, SIJ shotgun, L hip SL squat    Zebedee Iba, PT 06/28/2023, 4:08 PM

## 2023-07-19 ENCOUNTER — Encounter (HOSPITAL_BASED_OUTPATIENT_CLINIC_OR_DEPARTMENT_OTHER): Payer: Self-pay | Admitting: Physical Therapy

## 2023-07-19 ENCOUNTER — Ambulatory Visit (HOSPITAL_BASED_OUTPATIENT_CLINIC_OR_DEPARTMENT_OTHER): Admitting: Physical Therapy

## 2023-07-19 DIAGNOSIS — M545 Low back pain, unspecified: Secondary | ICD-10-CM | POA: Diagnosis not present

## 2023-07-19 NOTE — Therapy (Signed)
 OUTPATIENT PHYSICAL THERAPY THORACOLUMBAR EVALUATION   Patient Name: Phyllis Francis MRN: [id number] DOB:[birth year], 43 y.o., female Today's Date: 07/19/2023  END OF SESSION:  PT End of Session - 07/19/23 1532     Visit Number 3    Number of Visits 13    Date for PT Re-Evaluation 07/25/23    Authorization Type [employer]    PT Start Time 1531    PT Stop Time 1615    PT Time Calculation (min) 44 min    Activity Tolerance Patient tolerated treatment well    Behavior During Therapy [facility] for tasks assessed/performed              Past Medical History:  Diagnosis Date   Encounter for counseling regarding contraception 03/22/2023   GERD (gastroesophageal reflux disease) 2004   IBS (irritable bowel syndrome)    Past Surgical History:  Procedure Laterality Date   APPENDECTOMY     BREAST ENHANCEMENT SURGERY     CESAREAN SECTION     COSMETIC SURGERY  2010   Breast augmentation   TUBAL LIGATION     WISDOM TOOTH EXTRACTION     Patient Active Problem List   Diagnosis Date Noted   Encounter for counseling regarding contraception 03/22/2023   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   Abnormal Pap smear of cervix 03/22/2023   Appendicitis 03/22/2023   Atypical squamous cell changes of cervix undetermined significance favor benign 03/22/2023   Encounter for physical examination of student 03/22/2023   Screening for malignant neoplasm of cervix 03/22/2023   Irritable bowel syndrome 03/22/2023   Low back pain 03/22/2023   Diarrhea 05/30/2021   Skin irritation 05/02/2021   Migraine 05/02/2021   Hair loss 05/02/2021   Infectious colitis 12/19/2020   IBS (irritable bowel syndrome) 10/02/2020   Psoriasis 04/27/2018   Migraines 04/27/2008    PCP:  de [place], Raymond J, MD    REFERRING PROVIDER:  de [place], Raymond J, MD    REFERRING DIAG:  [phone] (ICD-10-CM) - Acute left-sided low back pain with left-sided sciatica     Rationale for Evaluation and Treatment:  Rehabilitation  THERAPY DIAG:  Pain, lumbar region  ONSET DATE: >1 month  SUBJECTIVE:                                                                                                                                                                                           SUBJECTIVE STATEMENT:  Pt states the pain is about the same since last session. Pt reports pain into the "sciatic nerve" with squatting still. Pt report she is very sore today from working out.   Eval:  Pt states she hurt her back from possibly from squatting without a belt. She saw MD and was prescribed Mobic- has improved pain. Pt still able to do RDL without pain. Pt has been focusing on form and technique with spouse who is a Systems analyst. Pt states that the pain will shoot down the leg and into the foot. It will mostly be posterior in nature. Denies NT. 135-150lbs without pain but higher will increase pain. Pt has been using a wedge for more upright positioning. Pt was doing 4x10 when she hurt her back. She was doing legs 2x/week. Denies red flags. Denies focus weakness.   PERTINENT HISTORY:  C sections  PAIN:  Are you having pain? Yes: NPRS scale: 0/10;  7/10 Pain location: L SIJ area  Pain description: "positional"  Aggravating factors: barbell back squat Relieving factors: meds,   PRECAUTIONS: None  RED FLAGS: None   WEIGHT BEARING RESTRICTIONS: No  FALLS:  Has patient fallen in last 6 months? No  LIVING ENVIRONMENT: Lives with: lives with their family   OCCUPATION: [employer]  PLOF: Independent  PATIENT GOALS: return to gym, exercise    OBJECTIVE:  Note: Objective measures were completed at Evaluation unless otherwise noted.   DIAGNOSTIC FINDINGS:    FINDINGS: There is no evidence of lumbar spine fracture. Alignment is normal. Intervertebral disc spaces are maintained.   IMPRESSION: Negative.   PATIENT SURVEYS:  FOTO 72 80 @ DC 5 pts MCII   MUSCLE  LENGTH: Hamstrings: (-) supine 90/90   POSTURE: WFL   PALPATION: TTP and hypertonicity of bilat lumbar paraspinals Ext stiffness of L2-5 on L  TTP and hypertonicity of L hip rotators and glutes   LUMBAR ROM:    AROM eval  Flexion WNL  Extension WNL  Right lateral flexion WNL  Left lateral flexion WNL  Right rotation WNL  Left rotation WNL   (Blank rows = not tested)   LOWER EXTREMITY ROM:   none painful; lack of L hip extension, ABD, flexion,  and ER rotation; WFL for tasks assessed in clinic   LOWER EXTREMITY MMT:  none painful   MMT Right eval Left eval  Hip flexion 61.8 lbs 63.1  Hip extension      Hip abduction 47.8 52.0  Hip adduction    Hip internal rotation      Hip external rotation      Knee flexion      Knee extension 5/5 5/5  Ankle dorsiflexion 5/5 5/5  Ankle plantarflexion      Ankle inversion      Ankle eversion       (Blank rows = not tested)   LUMBAR SPECIAL TESTS:  Slump test: Negative, SI Compression/distraction test: Negative, and FABER test: Negative  5x BW squats: decreased L hip max flexion position; lack of bilat ankle mobility; L hip ER deficit  Lunge: WFL bilat     GAIT: Distance walked: 86ft Assistive device utilized: None Level of assistance: Complete Independence Comments: WFL   TODAY'S TREATMENT:  DATE:   3/24  Barbell back squat analysis- reduce depth due to ankle collapse, hip ER position deficit, and lumbar rounding  Tsting of front squat or high bar to reduce hip ext moment  Programming, tracking, volume management to aid in recovery and progressive overload  8" box step down retest: instability on L but improved from previous session   3/3   STM L hip L lumbar UPA grade III L1-5  Exercises - Piriformis Mobilization on Foam Roll  - 1 x daily - 7 x weekly - 1 sets - 1 reps - 2-5 min  hold - Pigeon Pose  - 2 x daily - 7 x weekly - 1 sets - 3 reps - 30 hold - Single leg bridge on bench  - 1 x daily - 3 x weekly - 3 sets - 8 reps - Side Plank with Clam and Green Band Resistance  - 1 x daily - 3 x weekly - 2 sets - 10 reps - 2 hold - Lateral Step Down  - 1 x daily - 3 x weekly - 2 sets - 10 reps   12/30   STM L hip  Exercises - Piriformis Mobilization on Foam Roll  - 1 x daily - 7 x weekly - 1 sets - 1 reps - 2-5 min hold - Standing Figure 4 Piriformis Stretch at Wall  - 1 x daily - 7 x weekly - 1 sets - 3 reps - 30 hold - Goblet Squat with Kettlebell  - 1 x daily - 2-3 x weekly - 2 sets - 10 reps - 5 hold - Standing Repeated Hip Abduction with Resistance  - 1 x daily - 2-3 x weekly - 2 sets - 10 reps     PATIENT EDUCATION:  Education details:  MOI, diagnosis, prognosis, anatomy, exercise progression, DOMS expectations, muscle firing,  envelope of function, HEP, POC  Person educated: Patient Education method: Explanation, Demonstration, Tactile cues, Verbal cues,  Education comprehension: verbalized understanding, returned demonstration, and verbal cues required     HOME EXERCISE PROGRAM:     Access Code: [email or web address] URL: [email or web address] Date: 04/26/2023 Prepared by: Zebedee Iba              ASSESSMENT:   CLINICAL IMPRESSION:   Patient session today focused largely on education regarding programming parameters for better progressive overload and recovery between workouts.  Patient has no pain at baseline but does have recurring pain following her gym-based exercise.  Home exercise program correcting for movement and mobility deficits that continues to improve with each week.  However, patient advised to journal and start a strength program with stepwise increase in intensity and volume to utilize SAID principle.  Patient to utilize more upright squat techniques,  track her workouts, and started progressive program this week and extend final visit  out to 4 weeks in order for her to trial strength program.  Pt would benefit from continued skilled therapy in order to reach goals and maximize functional lumbopelvic strength and ROM for full return to PLOF.    OBJECTIVE IMPAIRMENTS Abnormal gait, decreased activity tolerance, decreased ROM, decreased strength, hypomobility, increased muscle spasms, impaired flexibility, improper body mechanics, postural dysfunction, and pain.    ACTIVITY LIMITATIONS carrying, lifting, bending, sitting, standing, squatting, and locomotion level   PARTICIPATION LIMITATIONS: community activity, occupation, and exercise   PERSONAL FACTORS Age, Fitness, Time since onset of injury/illness/exacerbation, and 1-2 comorbidities:  are also affecting patient's functional outcome.   REHAB POTENTIAL: Good   CLINICAL  DECISION MAKING: Stable/uncomplicated   EVALUATION COMPLEXITY: Low         GOALS:     SHORT TERM GOALS: Target date: 06/07/2023     Pt will become independent with HEP in order to demonstrate synthesis of PT education.     Goal status: MET   2.  Pt will report at least 2 pt reduction on NPRS scale for pain at worst in order to demonstrate functional improvement with household activity, self care, and ADL.    Goal status: MET   3.  Pt will score at least 5 pt increase on FOTO to demonstrate functional improvement in MCII and pt perceived function.     Goal status: INITIAL     LONG TERM GOALS: Target date: 07/25/2023      Pt  will become independent with final HEP in order to demonstrate synthesis of PT education.    Goal status: INITIAL   2.  Pt will score >/= 82 on FOTO to demonstrate improvement in perceived lumbar function.      Goal status: INITIAL   3.  Pt will be able to demonstrate/report ability to sit/stand/sleep for extended periods of time without pain in order to demonstrate functional improvement and tolerance to static positioning.    Goal status: INITIAL   4.  Pt  will be able to lift/squat/hold >100 lbs in order to demonstrate functional improvement in lumbopelvic strength for return to PLOF and exercise.    Goal status: INITIAL     PLAN:   PT FREQUENCY: 1-2x/week   PT DURATION: 12 weeks (plan to D/C in 8 wks)    PLANNED INTERVENTIONS: Therapeutic exercises, Therapeutic activity, Neuromuscular re-education, Balance training, Gait training, Patient/Family education, Joint manipulation, Joint mobilization, Stair training, Orthotic/Fit training, DME instructions, Aquatic Therapy, Dry Needling, Electrical stimulation, Spinal manipulation, Spinal mobilization, Cryotherapy, Moist heat, scar mobilization, Splintting, Taping, Vasopneumatic device, Traction, Ultrasound, Ionotophoresis 4mg /ml Dexamethasone, Manual therapy, and Re-evaluation.   PLAN FOR NEXT SESSION: L hip mobs, STM/joint mobs, SIJ shotgun, L hip SL squat    Zebedee Iba, PT 07/19/2023, 4:21 PM

## 2023-08-09 ENCOUNTER — Encounter (HOSPITAL_BASED_OUTPATIENT_CLINIC_OR_DEPARTMENT_OTHER): Admitting: Physical Therapy

## 2023-08-11 ENCOUNTER — Encounter (HOSPITAL_BASED_OUTPATIENT_CLINIC_OR_DEPARTMENT_OTHER): Payer: Self-pay | Admitting: Family Medicine

## 2023-08-23 ENCOUNTER — Encounter (HOSPITAL_BASED_OUTPATIENT_CLINIC_OR_DEPARTMENT_OTHER): Admitting: Physical Therapy

## 2023-11-30 ENCOUNTER — Encounter (HOSPITAL_BASED_OUTPATIENT_CLINIC_OR_DEPARTMENT_OTHER): Admitting: Family Medicine

## 2023-12-01 ENCOUNTER — Telehealth (HOSPITAL_BASED_OUTPATIENT_CLINIC_OR_DEPARTMENT_OTHER): Payer: Self-pay | Admitting: *Deleted

## 2023-12-01 NOTE — Telephone Encounter (Signed)
**Note De-identified  [place] Obfuscation** Please advise 

## 2023-12-01 NOTE — Telephone Encounter (Signed)
 Copied from CRM [id number]. 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  Can we get a letter that she can't be staffed in there - so they can put her in another job opporty?  Pls call her.

## 2023-12-03 NOTE — Telephone Encounter (Signed)
 Lvm informing pt that the letter has been created.

## 2023-12-03 NOTE — Telephone Encounter (Signed)
Note has been created

## 2023-12-29 ENCOUNTER — Other Ambulatory Visit (HOSPITAL_BASED_OUTPATIENT_CLINIC_OR_DEPARTMENT_OTHER): Payer: Self-pay | Admitting: *Deleted

## 2023-12-29 MED ORDER — CYCLOBENZAPRINE HCL 5 MG PO TABS: 5.0000 mg | ORAL_TABLET | Freq: Three times a day (TID) | ORAL | 1 refills | Status: AC | PRN

## 2024-01-06 ENCOUNTER — Other Ambulatory Visit (HOSPITAL_BASED_OUTPATIENT_CLINIC_OR_DEPARTMENT_OTHER): Payer: Self-pay

## 2024-01-06 MED ORDER — FLUZONE 0.5 ML IM SUSY
0.5000 mL | PREFILLED_SYRINGE | Freq: Once | INTRAMUSCULAR | 0 refills | Status: AC
  Filled 2024-01-06: qty 0.5, 1d supply, fill #0

## 2024-02-17 ENCOUNTER — Encounter (HOSPITAL_BASED_OUTPATIENT_CLINIC_OR_DEPARTMENT_OTHER): Payer: Self-pay | Admitting: Family Medicine

## 2024-02-17 DIAGNOSIS — Z0000 Encounter for general adult medical examination without abnormal findings: Secondary | ICD-10-CM

## 2024-02-17 NOTE — Telephone Encounter (Signed)
 Please see mychart message sent by pt and advise.

## 2024-02-28 MED ORDER — CLOBETASOL PROPIONATE 0.05 % EX SOLN: 1.0000 | Freq: Two times a day (BID) | CUTANEOUS | 0 refills | Status: AC

## 2024-04-24 ENCOUNTER — Ambulatory Visit (INDEPENDENT_AMBULATORY_CARE_PROVIDER_SITE_OTHER): Admitting: Family Medicine

## 2024-04-24 ENCOUNTER — Encounter (HOSPITAL_BASED_OUTPATIENT_CLINIC_OR_DEPARTMENT_OTHER): Payer: Self-pay | Admitting: Family Medicine

## 2024-04-24 VITALS — BP 101/32 | HR 62 | Temp 98.0°F | Resp 18 | Ht 65.0 in | Wt 152.0 lb

## 2024-04-24 DIAGNOSIS — Z0000 Encounter for general adult medical examination without abnormal findings: Secondary | ICD-10-CM | POA: Insufficient documentation

## 2024-04-24 DIAGNOSIS — N879 Dysplasia of cervix uteri, unspecified: Secondary | ICD-10-CM | POA: Insufficient documentation

## 2024-04-24 LAB — CBC WITH DIFFERENTIAL/PLATELET
Basophils Absolute: 0.1 x10E3/uL (ref 0.0–0.2)
Basos: 1 %
EOS (ABSOLUTE): 0.5 x10E3/uL — ABNORMAL HIGH (ref 0.0–0.4)
Eos: 8 %
Hematocrit: 42.3 % (ref 34.0–46.6)
Hemoglobin: 13.7 g/dL (ref 11.1–15.9)
Immature Grans (Abs): 0 x10E3/uL (ref 0.0–0.1)
Immature Granulocytes: 0 %
Lymphocytes Absolute: 1.4 x10E3/uL (ref 0.7–3.1)
Lymphs: 23 %
MCH: 30.6 pg (ref 26.6–33.0)
MCHC: 32.4 g/dL (ref 31.5–35.7)
MCV: 95 fL (ref 79–97)
Monocytes Absolute: 0.7 x10E3/uL (ref 0.1–0.9)
Monocytes: 11 %
Neutrophils Absolute: 3.2 x10E3/uL (ref 1.4–7.0)
Neutrophils: 57 %
Platelets: 148 x10E3/uL — ABNORMAL LOW (ref 150–450)
RBC: 4.47 x10E6/uL (ref 3.77–5.28)
RDW: 11.8 % (ref 11.7–15.4)
WBC: 5.8 x10E3/uL (ref 3.4–10.8)

## 2024-04-24 LAB — HEMOGLOBIN A1C
Est. average glucose Bld gHb Est-mCnc: 105 mg/dL
Hgb A1c MFr Bld: 5.3 % (ref 4.8–5.6)

## 2024-04-24 LAB — LIPID PANEL
Chol/HDL Ratio: 2 ratio (ref 0.0–4.4)
Cholesterol, Total: 166 mg/dL (ref 100–199)
HDL: 85 mg/dL
LDL Chol Calc (NIH): 71 mg/dL (ref 0–99)
Triglycerides: 47 mg/dL (ref 0–149)
VLDL Cholesterol Cal: 10 mg/dL (ref 5–40)

## 2024-04-24 LAB — COMPREHENSIVE METABOLIC PANEL WITH GFR
ALT: 15 IU/L (ref 0–32)
AST: 26 IU/L (ref 0–40)
Albumin: 4.4 g/dL (ref 3.9–4.9)
Alkaline Phosphatase: 48 IU/L (ref 41–116)
BUN/Creatinine Ratio: 20 (ref 9–23)
BUN: 17 mg/dL (ref 6–24)
Bilirubin Total: 0.4 mg/dL (ref 0.0–1.2)
CO2: 21 mmol/L (ref 20–29)
Calcium: 9.1 mg/dL (ref 8.7–10.2)
Chloride: 103 mmol/L (ref 96–106)
Creatinine, Ser: 0.87 mg/dL (ref 0.57–1.00)
Globulin, Total: 2.2 g/dL (ref 1.5–4.5)
Glucose: 90 mg/dL (ref 70–99)
Potassium: 4.3 mmol/L (ref 3.5–5.2)
Sodium: 139 mmol/L (ref 134–144)
Total Protein: 6.6 g/dL (ref 6.0–8.5)
eGFR: 84 mL/min/1.73

## 2024-04-24 LAB — TSH RFX ON ABNORMAL TO FREE T4: TSH: 2.27 u[IU]/mL (ref 0.450–4.500)

## 2024-04-24 NOTE — Assessment & Plan Note (Addendum)
 Routine HCM labs ordered. HCM reviewed/discussed. Anticipatory guidance regarding healthy weight, lifestyle and choices given. Recommend healthy diet.  Recommend approximately 150 minutes/week of moderate intensity exercise Recommend regular dental and vision exams Always use seatbelt/lap and shoulder restraints Recommend using smoke alarms and checking batteries at least twice a year Recommend using sunscreen when outside Discussed immunization recommendations Patient does still follow with her OB/GYN, however has been reluctant to do so as she had mammogram completed at prior visit which she was told about area of irregularity and on follow-up, this ended up being unremarkable on recheck.  She has thus been somewhat hesitant to follow-up.  Discussed recommendation to proceed with routine follow-up with OB/GYN.  Ultimately, if she would like to establish with new office, we can also assist with referral for this.

## 2024-04-24 NOTE — Progress Notes (Signed)
 " Subjective:    CC: Annual Physical Exam  HPI: Sarabella Caprio is a 43 y.o. presenting for annual physical  I reviewed the past medical history, family history, social history, surgical history, and allergies today and no changes were needed.  Please see the problem list section below in epic for further details.  Past Medical History: Past Medical History:  Diagnosis Date   Encounter for counseling regarding contraception 03/22/2023   GERD (gastroesophageal reflux disease) 2004   IBS (irritable bowel syndrome)    Past Surgical History: Past Surgical History:  Procedure Laterality Date   APPENDECTOMY     BREAST ENHANCEMENT SURGERY     CESAREAN SECTION     COSMETIC SURGERY  2010   Breast augmentation   TUBAL LIGATION     WISDOM TOOTH EXTRACTION     Social History: Social History   Socioeconomic History   Marital status: Married    Spouse name: Not on file   Number of children: Not on file   Years of education: Not on file   Highest education level: Not on file  Occupational History   Not on file  Tobacco Use   Smoking status: Never    Passive exposure: Never   Smokeless tobacco: Never  Vaping Use   Vaping status: Never Used  Substance and Sexual Activity   Alcohol use: Yes    Comment: Occasionally   Drug use: Never   Sexual activity: Yes    Birth control/protection: Surgical  Other Topics Concern   Not on file  Social History Narrative   Not on file   Social Drivers of Health   Tobacco Use: Low Risk (04/24/2024)   Patient History    Smoking Tobacco Use: Never    Smokeless Tobacco Use: Never    Passive Exposure: Never  Financial Resource Strain: Not on file  Food Insecurity: Not on file  Transportation Needs: Not on file  Physical Activity: Not on file  Stress: Not on file  Social Connections: Unknown (09/09/2021)   Received from [facility]   Social Network    Social Network: Not on file  Depression (PHQ2-9): Low Risk (05/31/2023)   Depression  (PHQ2-9)    PHQ-2 Score: 0  Alcohol Screen: Not on file  Housing: Not on file  Utilities: Not on file  Health Literacy: Not on file   Family History: Family History  Problem Relation Age of Onset   Cancer Father    Cancer Paternal Grandfather    Colon cancer Neg Hx    Esophageal cancer Neg Hx    Pancreatic cancer Neg Hx    Stomach cancer Neg Hx    Allergies: Allergies[1] Medications: See med rec.  Review of Systems: No headache, visual changes, nausea, vomiting, diarrhea, constipation, dizziness, abdominal pain, skin rash, fevers, chills, night sweats, swollen lymph nodes, weight loss, chest pain, body aches, joint swelling, muscle aches, shortness of breath, mood changes, visual or auditory hallucinations.  Objective:    BP (!) 101/32 (BP Location: Right Arm, Patient Position: Sitting, Cuff Size: Normal)   Pulse 62   Temp 98 F (36.7 C) (Oral)   Resp 18   Ht 5' 5 (1.651 m)   Wt 152 lb (68.9 kg)   LMP 03/24/2024 (Approximate)   SpO2 100%   BMI 25.29 kg/m   General: Well Developed, well nourished, and in no acute distress. Neuro: Alert and oriented x3, extra-ocular muscles intact, sensation grossly intact. Cranial nerves II through XII are intact, motor, sensory, and coordinative functions  are all intact. HEENT: Normocephalic, atraumatic, pupils equal round reactive to light, neck supple, no masses, no lymphadenopathy, thyroid nonpalpable. Oropharynx, nasopharynx, external ear canals are unremarkable. Skin: Warm and dry, no rashes noted. Cardiac: Regular rate and rhythm, no murmurs rubs or gallops. Respiratory: Clear to auscultation bilaterally. Not using accessory muscles, speaking in full sentences. Abdominal: Soft, nontender, nondistended, positive bowel sounds, no masses, no organomegaly. Musculoskeletal: Shoulder, elbow, wrist, hip, knee, ankle stable, and with full range of motion.  Impression and Recommendations:    Wellness examination Assessment &  Plan: Routine HCM labs ordered. HCM reviewed/discussed. Anticipatory guidance regarding healthy weight, lifestyle and choices given. Recommend healthy diet.  Recommend approximately 150 minutes/week of moderate intensity exercise Recommend regular dental and vision exams Always use seatbelt/lap and shoulder restraints Recommend using smoke alarms and checking batteries at least twice a year Recommend using sunscreen when outside Discussed immunization recommendations Patient does still follow with her OB/GYN, however has been reluctant to do so as she had mammogram completed at prior visit which she was told about area of irregularity and on follow-up, this ended up being unremarkable on recheck.  She has thus been somewhat hesitant to follow-up.  Discussed recommendation to proceed with routine follow-up with OB/GYN.  Ultimately, if she would like to establish with new office, we can also assist with referral for this.  Orders: -     TSH Rfx on Abnormal to Free T4 -     Lipid panel -     Hemoglobin A1c -     Comprehensive metabolic panel with GFR -     CBC with Differential/Platelet  Return in about 1 year (around 04/24/2025) for CPE.   ___________________________________________ Rafiq Bucklin de [place], MD, ABFM, CAQSM Primary Care and Sports Medicine [facility]    [1]  Allergies Allergen Reactions   Sulfa Antibiotics Anaphylaxis   Cephalosporins Rash   "

## 2025-04-25 ENCOUNTER — Encounter (HOSPITAL_BASED_OUTPATIENT_CLINIC_OR_DEPARTMENT_OTHER): Admitting: Family Medicine
